# Patient Record
Sex: Female | Born: 1950
Health system: Southern US, Community
[De-identification: ages and names within clinical notes are randomized; demographics above are authoritative.]

## PROBLEM LIST (undated history)

## (undated) DIAGNOSIS — I10 Essential (primary) hypertension: Secondary | ICD-10-CM

## (undated) DIAGNOSIS — M1990 Unspecified osteoarthritis, unspecified site: Secondary | ICD-10-CM

## (undated) DIAGNOSIS — C50919 Malignant neoplasm of unspecified site of unspecified female breast: Secondary | ICD-10-CM

## (undated) DIAGNOSIS — G2581 Restless legs syndrome: Secondary | ICD-10-CM

## (undated) DIAGNOSIS — F329 Major depressive disorder, single episode, unspecified: Secondary | ICD-10-CM

## (undated) DIAGNOSIS — Z8619 Personal history of other infectious and parasitic diseases: Secondary | ICD-10-CM

## (undated) DIAGNOSIS — Z86010 Personal history of colonic polyps: Secondary | ICD-10-CM

## (undated) DIAGNOSIS — K5790 Diverticulosis of intestine, part unspecified, without perforation or abscess without bleeding: Secondary | ICD-10-CM

## (undated) DIAGNOSIS — F32A Depression, unspecified: Secondary | ICD-10-CM

## (undated) DIAGNOSIS — K589 Irritable bowel syndrome without diarrhea: Secondary | ICD-10-CM

## (undated) HISTORY — DX: Depression, unspecified: F32.A

## (undated) HISTORY — PX: PARTIAL HYSTERECTOMY: SHX80

## (undated) HISTORY — DX: Unspecified osteoarthritis, unspecified site: M19.90

## (undated) HISTORY — DX: Personal history of colonic polyps: Z86.010

## (undated) HISTORY — DX: Essential (primary) hypertension: I10

## (undated) HISTORY — DX: Restless legs syndrome: G25.81

## (undated) HISTORY — DX: Irritable bowel syndrome without diarrhea: K58.9

## (undated) HISTORY — PX: KNEE SURGERY: SHX244

## (undated) HISTORY — DX: Personal history of other infectious and parasitic diseases: Z86.19

## (undated) HISTORY — DX: Diverticulosis of intestine, part unspecified, without perforation or abscess without bleeding: K57.90

## (undated) HISTORY — DX: Malignant neoplasm of unspecified site of unspecified female breast: C50.919

## (undated) HISTORY — DX: Major depressive disorder, single episode, unspecified: F32.9

---

## 1998-06-30 ENCOUNTER — Other Ambulatory Visit: Admission: RE | Admit: 1998-06-30 | Discharge: 1998-06-30 | Payer: Self-pay | Admitting: Obstetrics and Gynecology

## 2000-05-29 ENCOUNTER — Other Ambulatory Visit: Admission: RE | Admit: 2000-05-29 | Discharge: 2000-05-29 | Payer: Self-pay | Admitting: Gynecology

## 2001-06-03 ENCOUNTER — Other Ambulatory Visit: Admission: RE | Admit: 2001-06-03 | Discharge: 2001-06-03 | Payer: Self-pay | Admitting: Gynecology

## 2001-09-02 ENCOUNTER — Encounter: Payer: Self-pay | Admitting: Pulmonary Disease

## 2001-09-02 ENCOUNTER — Ambulatory Visit (HOSPITAL_COMMUNITY): Admission: RE | Admit: 2001-09-02 | Discharge: 2001-09-02 | Payer: Self-pay | Admitting: Pulmonary Disease

## 2002-06-05 ENCOUNTER — Other Ambulatory Visit: Admission: RE | Admit: 2002-06-05 | Discharge: 2002-06-05 | Payer: Self-pay | Admitting: Gynecology

## 2003-06-15 ENCOUNTER — Other Ambulatory Visit: Admission: RE | Admit: 2003-06-15 | Discharge: 2003-06-15 | Payer: Self-pay | Admitting: Gynecology

## 2003-07-01 ENCOUNTER — Ambulatory Visit (HOSPITAL_COMMUNITY): Admission: RE | Admit: 2003-07-01 | Discharge: 2003-07-01 | Payer: Self-pay | Admitting: Orthopedic Surgery

## 2004-06-19 ENCOUNTER — Other Ambulatory Visit: Admission: RE | Admit: 2004-06-19 | Discharge: 2004-06-19 | Payer: Self-pay | Admitting: Gynecology

## 2004-09-17 DIAGNOSIS — C50919 Malignant neoplasm of unspecified site of unspecified female breast: Secondary | ICD-10-CM

## 2004-09-17 HISTORY — DX: Malignant neoplasm of unspecified site of unspecified female breast: C50.919

## 2004-11-22 ENCOUNTER — Encounter: Admission: RE | Admit: 2004-11-22 | Discharge: 2004-11-22 | Payer: Self-pay | Admitting: General Surgery

## 2004-11-24 ENCOUNTER — Encounter (INDEPENDENT_AMBULATORY_CARE_PROVIDER_SITE_OTHER): Payer: Self-pay | Admitting: *Deleted

## 2004-11-24 ENCOUNTER — Encounter: Admission: RE | Admit: 2004-11-24 | Discharge: 2004-11-24 | Payer: Self-pay | Admitting: General Surgery

## 2004-12-01 ENCOUNTER — Encounter: Admission: RE | Admit: 2004-12-01 | Discharge: 2004-12-01 | Payer: Self-pay | Admitting: General Surgery

## 2004-12-12 ENCOUNTER — Ambulatory Visit (HOSPITAL_COMMUNITY): Admission: RE | Admit: 2004-12-12 | Discharge: 2004-12-12 | Payer: Self-pay | Admitting: General Surgery

## 2004-12-12 ENCOUNTER — Encounter: Admission: RE | Admit: 2004-12-12 | Discharge: 2004-12-12 | Payer: Self-pay | Admitting: General Surgery

## 2004-12-12 ENCOUNTER — Encounter (INDEPENDENT_AMBULATORY_CARE_PROVIDER_SITE_OTHER): Payer: Self-pay | Admitting: *Deleted

## 2004-12-12 ENCOUNTER — Ambulatory Visit (HOSPITAL_BASED_OUTPATIENT_CLINIC_OR_DEPARTMENT_OTHER): Admission: RE | Admit: 2004-12-12 | Discharge: 2004-12-12 | Payer: Self-pay | Admitting: General Surgery

## 2004-12-25 ENCOUNTER — Ambulatory Visit: Admission: RE | Admit: 2004-12-25 | Discharge: 2005-03-09 | Payer: Self-pay | Admitting: *Deleted

## 2005-04-03 ENCOUNTER — Ambulatory Visit: Admission: RE | Admit: 2005-04-03 | Discharge: 2005-04-03 | Payer: Self-pay | Admitting: *Deleted

## 2009-12-29 ENCOUNTER — Ambulatory Visit: Payer: Self-pay | Admitting: Gastroenterology

## 2010-01-23 ENCOUNTER — Encounter: Admission: RE | Admit: 2010-01-23 | Discharge: 2010-01-23 | Payer: Self-pay | Admitting: Gastroenterology

## 2010-10-08 ENCOUNTER — Encounter: Payer: Self-pay | Admitting: Gastroenterology

## 2010-10-08 ENCOUNTER — Encounter: Payer: Self-pay | Admitting: Family Medicine

## 2011-02-02 NOTE — Op Note (Signed)
NAMEMERI, PELOT                ACCOUNT NO.:  [id number]   MEDICAL RECORD NO.:  [id number]          PATIENT TYPE:  AMB   LOCATION:  DSC                          FACILITY:  [facility]   PHYSICIAN:  Rose Phi. Maple Hudson, M.D.   DATE OF BIRTH:  11-22-1950   DATE OF PROCEDURE:  12/12/2004  DATE OF DISCHARGE:                                 OPERATIVE REPORT   PREOPERATIVE DIAGNOSIS:  Ductal carcinoma in situ of the right breast.   POSTOPERATIVE DIAGNOSIS:  Ductal carcinoma in situ of the right breast.   OPERATION:  Right partial mastectomy with needle localization and specimen  mammogram.   SURGEON:  Rose Phi. Maple Hudson, M.D.   ANESTHESIA:  General.   OPERATIVE PROCEDURE:  After a suitable general anesthesia was induced, the  patient was placed in a supine position with the arms extended on the arm  board.  Right breast prepped and draped in the usual fashion.  Prior to  coming in the operating room, a localizing wire had been placed in the upper  portion of her right breast.   A curved incision utilizing the wire and the information was then outlined,  and then we made an incision, and I exposed the wire and then removed the  wire and surrounding tissue.  It was oriented for the pathologist.  Specimen  mammography confirmed the removal of the lesion with what appeared to be  good margins.   Hemostasis was obtained with the cautery, and we then thoroughly infiltrated  the incision with 0.25% Marcaine.  It was then closed in two layers with 3-0  Vicryl and subcuticular 4-0 Monocryl and Steri-Strips.   Dressings were applied and the patient transferred to the recovery room in  satisfactory condition, having tolerated the procedure well.      PRY/MEDQ  D:  12/12/2004  T:  12/12/2004  Job:  [id number]

## 2011-07-06 ENCOUNTER — Ambulatory Visit
Admission: RE | Admit: 2011-07-06 | Discharge: 2011-07-06 | Disposition: A | Discharge status: Home / Self Care | Payer: BC Managed Care – PPO | Source: Ambulatory Visit | Attending: Family Medicine | Admitting: Family Medicine

## 2011-07-06 ENCOUNTER — Other Ambulatory Visit: Payer: Self-pay | Admitting: Family Medicine

## 2011-07-06 DIAGNOSIS — R1011 Right upper quadrant pain: Secondary | ICD-10-CM

## 2011-07-06 DIAGNOSIS — R1013 Epigastric pain: Secondary | ICD-10-CM

## 2012-01-18 ENCOUNTER — Other Ambulatory Visit: Payer: Self-pay | Admitting: Internal Medicine

## 2012-01-18 DIAGNOSIS — M545 Low back pain: Secondary | ICD-10-CM

## 2012-01-18 DIAGNOSIS — M5137 Other intervertebral disc degeneration, lumbosacral region: Secondary | ICD-10-CM

## 2012-01-18 DIAGNOSIS — M47817 Spondylosis without myelopathy or radiculopathy, lumbosacral region: Secondary | ICD-10-CM

## 2012-01-25 ENCOUNTER — Ambulatory Visit
Admission: RE | Admit: 2012-01-25 | Discharge: 2012-01-25 | Disposition: A | Discharge status: Home / Self Care | Payer: BC Managed Care – PPO | Source: Ambulatory Visit | Attending: Internal Medicine | Admitting: Internal Medicine

## 2012-01-25 DIAGNOSIS — M5137 Other intervertebral disc degeneration, lumbosacral region: Secondary | ICD-10-CM

## 2012-01-25 DIAGNOSIS — M545 Low back pain: Secondary | ICD-10-CM

## 2012-01-25 DIAGNOSIS — M47817 Spondylosis without myelopathy or radiculopathy, lumbosacral region: Secondary | ICD-10-CM

## 2015-10-04 DIAGNOSIS — Z79899 Other long term (current) drug therapy: Secondary | ICD-10-CM | POA: Diagnosis not present

## 2015-10-04 DIAGNOSIS — R635 Abnormal weight gain: Secondary | ICD-10-CM | POA: Diagnosis not present

## 2015-10-04 DIAGNOSIS — Z6835 Body mass index (BMI) 35.0-35.9, adult: Secondary | ICD-10-CM | POA: Diagnosis not present

## 2015-10-04 DIAGNOSIS — E669 Obesity, unspecified: Secondary | ICD-10-CM | POA: Diagnosis not present

## 2015-11-08 DIAGNOSIS — B182 Chronic viral hepatitis C: Secondary | ICD-10-CM | POA: Diagnosis not present

## 2015-11-16 DIAGNOSIS — L84 Corns and callosities: Secondary | ICD-10-CM | POA: Diagnosis not present

## 2015-11-16 DIAGNOSIS — M2041 Other hammer toe(s) (acquired), right foot: Secondary | ICD-10-CM | POA: Diagnosis not present

## 2015-11-16 DIAGNOSIS — M2042 Other hammer toe(s) (acquired), left foot: Secondary | ICD-10-CM | POA: Diagnosis not present

## 2015-11-21 DIAGNOSIS — R945 Abnormal results of liver function studies: Secondary | ICD-10-CM | POA: Diagnosis not present

## 2015-11-21 DIAGNOSIS — K732 Chronic active hepatitis, not elsewhere classified: Secondary | ICD-10-CM | POA: Diagnosis not present

## 2015-12-05 DIAGNOSIS — R7989 Other specified abnormal findings of blood chemistry: Secondary | ICD-10-CM | POA: Diagnosis not present

## 2015-12-05 DIAGNOSIS — K732 Chronic active hepatitis, not elsewhere classified: Secondary | ICD-10-CM | POA: Diagnosis not present

## 2015-12-09 DIAGNOSIS — R7989 Other specified abnormal findings of blood chemistry: Secondary | ICD-10-CM | POA: Diagnosis not present

## 2015-12-26 DIAGNOSIS — Z1231 Encounter for screening mammogram for malignant neoplasm of breast: Secondary | ICD-10-CM | POA: Diagnosis not present

## 2015-12-26 DIAGNOSIS — Z6834 Body mass index (BMI) 34.0-34.9, adult: Secondary | ICD-10-CM | POA: Diagnosis not present

## 2015-12-26 DIAGNOSIS — Z0000 Encounter for general adult medical examination without abnormal findings: Secondary | ICD-10-CM | POA: Diagnosis not present

## 2015-12-28 DIAGNOSIS — Z1231 Encounter for screening mammogram for malignant neoplasm of breast: Secondary | ICD-10-CM | POA: Diagnosis not present

## 2016-01-02 DIAGNOSIS — Z23 Encounter for immunization: Secondary | ICD-10-CM | POA: Diagnosis not present

## 2016-01-02 DIAGNOSIS — K732 Chronic active hepatitis, not elsewhere classified: Secondary | ICD-10-CM | POA: Diagnosis not present

## 2016-01-02 DIAGNOSIS — R110 Nausea: Secondary | ICD-10-CM | POA: Diagnosis not present

## 2016-02-27 DIAGNOSIS — M4726 Other spondylosis with radiculopathy, lumbar region: Secondary | ICD-10-CM | POA: Diagnosis not present

## 2016-02-27 DIAGNOSIS — I10 Essential (primary) hypertension: Secondary | ICD-10-CM | POA: Diagnosis not present

## 2016-02-27 DIAGNOSIS — M47816 Spondylosis without myelopathy or radiculopathy, lumbar region: Secondary | ICD-10-CM | POA: Diagnosis not present

## 2016-02-27 DIAGNOSIS — Z79891 Long term (current) use of opiate analgesic: Secondary | ICD-10-CM | POA: Diagnosis not present

## 2016-02-27 DIAGNOSIS — M5136 Other intervertebral disc degeneration, lumbar region: Secondary | ICD-10-CM | POA: Diagnosis not present

## 2016-02-27 DIAGNOSIS — Z6833 Body mass index (BMI) 33.0-33.9, adult: Secondary | ICD-10-CM | POA: Diagnosis not present

## 2016-02-27 DIAGNOSIS — G894 Chronic pain syndrome: Secondary | ICD-10-CM | POA: Diagnosis not present

## 2016-02-27 DIAGNOSIS — F329 Major depressive disorder, single episode, unspecified: Secondary | ICD-10-CM | POA: Diagnosis not present

## 2016-02-27 DIAGNOSIS — M545 Low back pain: Secondary | ICD-10-CM | POA: Diagnosis not present

## 2016-02-27 DIAGNOSIS — R7301 Impaired fasting glucose: Secondary | ICD-10-CM | POA: Diagnosis not present

## 2016-02-27 DIAGNOSIS — M160 Bilateral primary osteoarthritis of hip: Secondary | ICD-10-CM | POA: Diagnosis not present

## 2016-02-27 DIAGNOSIS — M25552 Pain in left hip: Secondary | ICD-10-CM | POA: Diagnosis not present

## 2016-02-27 DIAGNOSIS — E559 Vitamin D deficiency, unspecified: Secondary | ICD-10-CM | POA: Diagnosis not present

## 2016-03-07 DIAGNOSIS — M545 Low back pain: Secondary | ICD-10-CM | POA: Diagnosis not present

## 2016-03-08 DIAGNOSIS — Z79899 Other long term (current) drug therapy: Secondary | ICD-10-CM | POA: Diagnosis not present

## 2016-03-08 DIAGNOSIS — I10 Essential (primary) hypertension: Secondary | ICD-10-CM | POA: Diagnosis not present

## 2016-03-08 DIAGNOSIS — Z23 Encounter for immunization: Secondary | ICD-10-CM | POA: Diagnosis not present

## 2016-03-08 DIAGNOSIS — K732 Chronic active hepatitis, not elsewhere classified: Secondary | ICD-10-CM | POA: Diagnosis not present

## 2016-04-10 DIAGNOSIS — M4806 Spinal stenosis, lumbar region: Secondary | ICD-10-CM | POA: Diagnosis not present

## 2016-04-10 DIAGNOSIS — G894 Chronic pain syndrome: Secondary | ICD-10-CM | POA: Diagnosis not present

## 2016-04-10 DIAGNOSIS — B1920 Unspecified viral hepatitis C without hepatic coma: Secondary | ICD-10-CM | POA: Diagnosis not present

## 2016-04-10 DIAGNOSIS — M5136 Other intervertebral disc degeneration, lumbar region: Secondary | ICD-10-CM | POA: Diagnosis not present

## 2016-04-10 DIAGNOSIS — M47816 Spondylosis without myelopathy or radiculopathy, lumbar region: Secondary | ICD-10-CM | POA: Diagnosis not present

## 2016-05-03 DIAGNOSIS — I10 Essential (primary) hypertension: Secondary | ICD-10-CM | POA: Diagnosis not present

## 2016-05-03 DIAGNOSIS — E559 Vitamin D deficiency, unspecified: Secondary | ICD-10-CM | POA: Diagnosis not present

## 2016-05-03 DIAGNOSIS — M5137 Other intervertebral disc degeneration, lumbosacral region: Secondary | ICD-10-CM | POA: Diagnosis not present

## 2016-05-03 DIAGNOSIS — Z6834 Body mass index (BMI) 34.0-34.9, adult: Secondary | ICD-10-CM | POA: Diagnosis not present

## 2016-05-03 DIAGNOSIS — F329 Major depressive disorder, single episode, unspecified: Secondary | ICD-10-CM | POA: Diagnosis not present

## 2016-05-03 DIAGNOSIS — M9983 Other biomechanical lesions of lumbar region: Secondary | ICD-10-CM | POA: Diagnosis not present

## 2016-05-03 DIAGNOSIS — M4726 Other spondylosis with radiculopathy, lumbar region: Secondary | ICD-10-CM | POA: Diagnosis not present

## 2016-05-03 DIAGNOSIS — R7301 Impaired fasting glucose: Secondary | ICD-10-CM | POA: Diagnosis not present

## 2016-05-11 DIAGNOSIS — K732 Chronic active hepatitis, not elsewhere classified: Secondary | ICD-10-CM | POA: Diagnosis not present

## 2016-05-11 DIAGNOSIS — Z79899 Other long term (current) drug therapy: Secondary | ICD-10-CM | POA: Diagnosis not present

## 2016-05-11 DIAGNOSIS — R5383 Other fatigue: Secondary | ICD-10-CM | POA: Diagnosis not present

## 2016-05-31 ENCOUNTER — Other Ambulatory Visit: Payer: Self-pay | Admitting: Internal Medicine

## 2016-05-31 DIAGNOSIS — M25551 Pain in right hip: Secondary | ICD-10-CM

## 2016-05-31 DIAGNOSIS — M5136 Other intervertebral disc degeneration, lumbar region: Secondary | ICD-10-CM

## 2016-05-31 DIAGNOSIS — M25552 Pain in left hip: Secondary | ICD-10-CM

## 2016-06-04 DIAGNOSIS — E559 Vitamin D deficiency, unspecified: Secondary | ICD-10-CM | POA: Diagnosis not present

## 2016-06-04 DIAGNOSIS — G894 Chronic pain syndrome: Secondary | ICD-10-CM | POA: Diagnosis not present

## 2016-06-04 DIAGNOSIS — M4726 Other spondylosis with radiculopathy, lumbar region: Secondary | ICD-10-CM | POA: Diagnosis not present

## 2016-06-04 DIAGNOSIS — Z79891 Long term (current) use of opiate analgesic: Secondary | ICD-10-CM | POA: Diagnosis not present

## 2016-06-04 DIAGNOSIS — M5137 Other intervertebral disc degeneration, lumbosacral region: Secondary | ICD-10-CM | POA: Diagnosis not present

## 2016-06-04 DIAGNOSIS — I10 Essential (primary) hypertension: Secondary | ICD-10-CM | POA: Diagnosis not present

## 2016-06-04 DIAGNOSIS — R7301 Impaired fasting glucose: Secondary | ICD-10-CM | POA: Diagnosis not present

## 2016-06-04 DIAGNOSIS — F329 Major depressive disorder, single episode, unspecified: Secondary | ICD-10-CM | POA: Diagnosis not present

## 2016-06-04 DIAGNOSIS — Z6834 Body mass index (BMI) 34.0-34.9, adult: Secondary | ICD-10-CM | POA: Diagnosis not present

## 2016-06-08 ENCOUNTER — Other Ambulatory Visit: Payer: Self-pay

## 2016-06-11 ENCOUNTER — Ambulatory Visit
Admission: RE | Admit: 2016-06-11 | Discharge: 2016-06-11 | Disposition: A | Discharge status: Home / Self Care | Payer: PPO | Source: Ambulatory Visit | Attending: Internal Medicine | Admitting: Internal Medicine

## 2016-06-11 DIAGNOSIS — M25552 Pain in left hip: Secondary | ICD-10-CM | POA: Diagnosis not present

## 2016-06-11 DIAGNOSIS — M4806 Spinal stenosis, lumbar region: Secondary | ICD-10-CM | POA: Diagnosis not present

## 2016-06-11 DIAGNOSIS — M25551 Pain in right hip: Secondary | ICD-10-CM

## 2016-06-11 DIAGNOSIS — M1611 Unilateral primary osteoarthritis, right hip: Secondary | ICD-10-CM | POA: Diagnosis not present

## 2016-06-11 DIAGNOSIS — M5136 Other intervertebral disc degeneration, lumbar region: Secondary | ICD-10-CM

## 2016-08-02 DIAGNOSIS — M5136 Other intervertebral disc degeneration, lumbar region: Secondary | ICD-10-CM | POA: Diagnosis not present

## 2016-08-02 DIAGNOSIS — M48061 Spinal stenosis, lumbar region without neurogenic claudication: Secondary | ICD-10-CM | POA: Diagnosis not present

## 2016-08-02 DIAGNOSIS — F329 Major depressive disorder, single episode, unspecified: Secondary | ICD-10-CM | POA: Diagnosis not present

## 2016-08-02 DIAGNOSIS — B1920 Unspecified viral hepatitis C without hepatic coma: Secondary | ICD-10-CM | POA: Diagnosis not present

## 2016-08-02 DIAGNOSIS — G894 Chronic pain syndrome: Secondary | ICD-10-CM | POA: Diagnosis not present

## 2016-08-02 DIAGNOSIS — M47816 Spondylosis without myelopathy or radiculopathy, lumbar region: Secondary | ICD-10-CM | POA: Diagnosis not present

## 2016-08-14 DIAGNOSIS — K732 Chronic active hepatitis, not elsewhere classified: Secondary | ICD-10-CM | POA: Diagnosis not present

## 2016-09-04 DIAGNOSIS — Z79899 Other long term (current) drug therapy: Secondary | ICD-10-CM | POA: Diagnosis not present

## 2016-09-04 DIAGNOSIS — I10 Essential (primary) hypertension: Secondary | ICD-10-CM | POA: Diagnosis not present

## 2016-09-04 DIAGNOSIS — R7301 Impaired fasting glucose: Secondary | ICD-10-CM | POA: Diagnosis not present

## 2016-09-04 DIAGNOSIS — E559 Vitamin D deficiency, unspecified: Secondary | ICD-10-CM | POA: Diagnosis not present

## 2016-10-15 DIAGNOSIS — B1920 Unspecified viral hepatitis C without hepatic coma: Secondary | ICD-10-CM | POA: Diagnosis not present

## 2016-10-15 DIAGNOSIS — M47816 Spondylosis without myelopathy or radiculopathy, lumbar region: Secondary | ICD-10-CM | POA: Diagnosis not present

## 2016-10-15 DIAGNOSIS — M1611 Unilateral primary osteoarthritis, right hip: Secondary | ICD-10-CM | POA: Diagnosis not present

## 2016-10-15 DIAGNOSIS — G894 Chronic pain syndrome: Secondary | ICD-10-CM | POA: Diagnosis not present

## 2016-10-15 DIAGNOSIS — M5136 Other intervertebral disc degeneration, lumbar region: Secondary | ICD-10-CM | POA: Diagnosis not present

## 2016-10-15 DIAGNOSIS — F329 Major depressive disorder, single episode, unspecified: Secondary | ICD-10-CM | POA: Diagnosis not present

## 2016-11-21 DIAGNOSIS — G894 Chronic pain syndrome: Secondary | ICD-10-CM | POA: Diagnosis not present

## 2016-11-21 DIAGNOSIS — M1611 Unilateral primary osteoarthritis, right hip: Secondary | ICD-10-CM | POA: Diagnosis not present

## 2016-11-21 DIAGNOSIS — F329 Major depressive disorder, single episode, unspecified: Secondary | ICD-10-CM | POA: Diagnosis not present

## 2016-11-21 DIAGNOSIS — M5136 Other intervertebral disc degeneration, lumbar region: Secondary | ICD-10-CM | POA: Diagnosis not present

## 2016-11-21 DIAGNOSIS — M47816 Spondylosis without myelopathy or radiculopathy, lumbar region: Secondary | ICD-10-CM | POA: Diagnosis not present

## 2016-11-21 DIAGNOSIS — B1920 Unspecified viral hepatitis C without hepatic coma: Secondary | ICD-10-CM | POA: Diagnosis not present

## 2016-11-26 DIAGNOSIS — M2062 Acquired deformities of toe(s), unspecified, left foot: Secondary | ICD-10-CM | POA: Diagnosis not present

## 2016-11-26 DIAGNOSIS — M2061 Acquired deformities of toe(s), unspecified, right foot: Secondary | ICD-10-CM | POA: Diagnosis not present

## 2016-11-26 DIAGNOSIS — L84 Corns and callosities: Secondary | ICD-10-CM | POA: Diagnosis not present

## 2016-12-24 DIAGNOSIS — M47816 Spondylosis without myelopathy or radiculopathy, lumbar region: Secondary | ICD-10-CM | POA: Diagnosis not present

## 2016-12-24 DIAGNOSIS — F329 Major depressive disorder, single episode, unspecified: Secondary | ICD-10-CM | POA: Diagnosis not present

## 2016-12-24 DIAGNOSIS — G894 Chronic pain syndrome: Secondary | ICD-10-CM | POA: Diagnosis not present

## 2016-12-24 DIAGNOSIS — M1611 Unilateral primary osteoarthritis, right hip: Secondary | ICD-10-CM | POA: Diagnosis not present

## 2016-12-24 DIAGNOSIS — M5136 Other intervertebral disc degeneration, lumbar region: Secondary | ICD-10-CM | POA: Diagnosis not present

## 2016-12-24 DIAGNOSIS — B1920 Unspecified viral hepatitis C without hepatic coma: Secondary | ICD-10-CM | POA: Diagnosis not present

## 2016-12-26 DIAGNOSIS — F329 Major depressive disorder, single episode, unspecified: Secondary | ICD-10-CM | POA: Diagnosis not present

## 2016-12-26 DIAGNOSIS — G894 Chronic pain syndrome: Secondary | ICD-10-CM | POA: Diagnosis not present

## 2016-12-26 DIAGNOSIS — M47816 Spondylosis without myelopathy or radiculopathy, lumbar region: Secondary | ICD-10-CM | POA: Diagnosis not present

## 2016-12-26 DIAGNOSIS — M1611 Unilateral primary osteoarthritis, right hip: Secondary | ICD-10-CM | POA: Diagnosis not present

## 2016-12-26 DIAGNOSIS — M5136 Other intervertebral disc degeneration, lumbar region: Secondary | ICD-10-CM | POA: Diagnosis not present

## 2016-12-26 DIAGNOSIS — B1920 Unspecified viral hepatitis C without hepatic coma: Secondary | ICD-10-CM | POA: Diagnosis not present

## 2017-01-08 DIAGNOSIS — E559 Vitamin D deficiency, unspecified: Secondary | ICD-10-CM | POA: Diagnosis not present

## 2017-01-08 DIAGNOSIS — E6609 Other obesity due to excess calories: Secondary | ICD-10-CM | POA: Diagnosis not present

## 2017-01-08 DIAGNOSIS — Z713 Dietary counseling and surveillance: Secondary | ICD-10-CM | POA: Diagnosis not present

## 2017-01-08 DIAGNOSIS — E790 Hyperuricemia without signs of inflammatory arthritis and tophaceous disease: Secondary | ICD-10-CM | POA: Diagnosis not present

## 2017-01-08 DIAGNOSIS — E782 Mixed hyperlipidemia: Secondary | ICD-10-CM | POA: Diagnosis not present

## 2017-01-08 DIAGNOSIS — E8881 Metabolic syndrome: Secondary | ICD-10-CM | POA: Diagnosis not present

## 2017-01-08 DIAGNOSIS — E039 Hypothyroidism, unspecified: Secondary | ICD-10-CM | POA: Diagnosis not present

## 2017-01-08 DIAGNOSIS — E1165 Type 2 diabetes mellitus with hyperglycemia: Secondary | ICD-10-CM | POA: Diagnosis not present

## 2017-01-08 DIAGNOSIS — I10 Essential (primary) hypertension: Secondary | ICD-10-CM | POA: Diagnosis not present

## 2017-01-11 DIAGNOSIS — Z6836 Body mass index (BMI) 36.0-36.9, adult: Secondary | ICD-10-CM | POA: Diagnosis not present

## 2017-01-11 DIAGNOSIS — Z1231 Encounter for screening mammogram for malignant neoplasm of breast: Secondary | ICD-10-CM | POA: Diagnosis not present

## 2017-01-11 DIAGNOSIS — J208 Acute bronchitis due to other specified organisms: Secondary | ICD-10-CM | POA: Diagnosis not present

## 2017-01-15 DIAGNOSIS — J208 Acute bronchitis due to other specified organisms: Secondary | ICD-10-CM | POA: Diagnosis not present

## 2017-01-15 DIAGNOSIS — Z6835 Body mass index (BMI) 35.0-35.9, adult: Secondary | ICD-10-CM | POA: Diagnosis not present

## 2017-01-15 DIAGNOSIS — T3695XA Adverse effect of unspecified systemic antibiotic, initial encounter: Secondary | ICD-10-CM | POA: Diagnosis not present

## 2017-01-21 DIAGNOSIS — G894 Chronic pain syndrome: Secondary | ICD-10-CM | POA: Diagnosis not present

## 2017-01-21 DIAGNOSIS — B1920 Unspecified viral hepatitis C without hepatic coma: Secondary | ICD-10-CM | POA: Diagnosis not present

## 2017-01-21 DIAGNOSIS — M5136 Other intervertebral disc degeneration, lumbar region: Secondary | ICD-10-CM | POA: Diagnosis not present

## 2017-01-21 DIAGNOSIS — M47816 Spondylosis without myelopathy or radiculopathy, lumbar region: Secondary | ICD-10-CM | POA: Diagnosis not present

## 2017-01-21 DIAGNOSIS — M1611 Unilateral primary osteoarthritis, right hip: Secondary | ICD-10-CM | POA: Diagnosis not present

## 2017-01-21 DIAGNOSIS — F329 Major depressive disorder, single episode, unspecified: Secondary | ICD-10-CM | POA: Diagnosis not present

## 2017-01-28 DIAGNOSIS — Z1389 Encounter for screening for other disorder: Secondary | ICD-10-CM | POA: Diagnosis not present

## 2017-01-28 DIAGNOSIS — Z23 Encounter for immunization: Secondary | ICD-10-CM | POA: Diagnosis not present

## 2017-01-28 DIAGNOSIS — Z1211 Encounter for screening for malignant neoplasm of colon: Secondary | ICD-10-CM | POA: Diagnosis not present

## 2017-01-28 DIAGNOSIS — Z9181 History of falling: Secondary | ICD-10-CM | POA: Diagnosis not present

## 2017-01-28 DIAGNOSIS — Z0000 Encounter for general adult medical examination without abnormal findings: Secondary | ICD-10-CM | POA: Diagnosis not present

## 2017-01-28 DIAGNOSIS — Z6835 Body mass index (BMI) 35.0-35.9, adult: Secondary | ICD-10-CM | POA: Diagnosis not present

## 2017-01-31 DIAGNOSIS — E669 Obesity, unspecified: Secondary | ICD-10-CM | POA: Diagnosis not present

## 2017-01-31 DIAGNOSIS — R7301 Impaired fasting glucose: Secondary | ICD-10-CM | POA: Diagnosis not present

## 2017-01-31 DIAGNOSIS — Z79899 Other long term (current) drug therapy: Secondary | ICD-10-CM | POA: Diagnosis not present

## 2017-01-31 DIAGNOSIS — Z0000 Encounter for general adult medical examination without abnormal findings: Secondary | ICD-10-CM | POA: Diagnosis not present

## 2017-01-31 DIAGNOSIS — I10 Essential (primary) hypertension: Secondary | ICD-10-CM | POA: Diagnosis not present

## 2017-01-31 DIAGNOSIS — Z6835 Body mass index (BMI) 35.0-35.9, adult: Secondary | ICD-10-CM | POA: Diagnosis not present

## 2017-02-08 DIAGNOSIS — E8881 Metabolic syndrome: Secondary | ICD-10-CM | POA: Diagnosis not present

## 2017-02-08 DIAGNOSIS — I10 Essential (primary) hypertension: Secondary | ICD-10-CM | POA: Diagnosis not present

## 2017-02-08 DIAGNOSIS — E6609 Other obesity due to excess calories: Secondary | ICD-10-CM | POA: Diagnosis not present

## 2017-02-08 DIAGNOSIS — Z713 Dietary counseling and surveillance: Secondary | ICD-10-CM | POA: Diagnosis not present

## 2017-02-11 DIAGNOSIS — Z1231 Encounter for screening mammogram for malignant neoplasm of breast: Secondary | ICD-10-CM | POA: Diagnosis not present

## 2017-02-11 DIAGNOSIS — M85852 Other specified disorders of bone density and structure, left thigh: Secondary | ICD-10-CM | POA: Diagnosis not present

## 2017-02-11 DIAGNOSIS — Z853 Personal history of malignant neoplasm of breast: Secondary | ICD-10-CM | POA: Diagnosis not present

## 2017-02-11 DIAGNOSIS — M8589 Other specified disorders of bone density and structure, multiple sites: Secondary | ICD-10-CM | POA: Diagnosis not present

## 2017-02-15 DIAGNOSIS — Z1211 Encounter for screening for malignant neoplasm of colon: Secondary | ICD-10-CM | POA: Diagnosis not present

## 2017-02-15 DIAGNOSIS — K581 Irritable bowel syndrome with constipation: Secondary | ICD-10-CM | POA: Diagnosis not present

## 2017-02-15 DIAGNOSIS — Z86010 Personal history of colonic polyps: Secondary | ICD-10-CM | POA: Diagnosis not present

## 2017-02-18 DIAGNOSIS — M1611 Unilateral primary osteoarthritis, right hip: Secondary | ICD-10-CM | POA: Diagnosis not present

## 2017-02-18 DIAGNOSIS — M7062 Trochanteric bursitis, left hip: Secondary | ICD-10-CM | POA: Diagnosis not present

## 2017-02-18 DIAGNOSIS — F329 Major depressive disorder, single episode, unspecified: Secondary | ICD-10-CM | POA: Diagnosis not present

## 2017-02-18 DIAGNOSIS — M47816 Spondylosis without myelopathy or radiculopathy, lumbar region: Secondary | ICD-10-CM | POA: Diagnosis not present

## 2017-02-18 DIAGNOSIS — G894 Chronic pain syndrome: Secondary | ICD-10-CM | POA: Diagnosis not present

## 2017-02-18 DIAGNOSIS — B1920 Unspecified viral hepatitis C without hepatic coma: Secondary | ICD-10-CM | POA: Diagnosis not present

## 2017-02-18 DIAGNOSIS — M5136 Other intervertebral disc degeneration, lumbar region: Secondary | ICD-10-CM | POA: Diagnosis not present

## 2017-03-11 DIAGNOSIS — E6609 Other obesity due to excess calories: Secondary | ICD-10-CM | POA: Diagnosis not present

## 2017-03-11 DIAGNOSIS — E8881 Metabolic syndrome: Secondary | ICD-10-CM | POA: Diagnosis not present

## 2017-03-11 DIAGNOSIS — Z713 Dietary counseling and surveillance: Secondary | ICD-10-CM | POA: Diagnosis not present

## 2017-03-11 DIAGNOSIS — I10 Essential (primary) hypertension: Secondary | ICD-10-CM | POA: Diagnosis not present

## 2017-03-18 DIAGNOSIS — F1120 Opioid dependence, uncomplicated: Secondary | ICD-10-CM | POA: Diagnosis not present

## 2017-03-18 DIAGNOSIS — M47816 Spondylosis without myelopathy or radiculopathy, lumbar region: Secondary | ICD-10-CM | POA: Diagnosis not present

## 2017-03-18 DIAGNOSIS — F329 Major depressive disorder, single episode, unspecified: Secondary | ICD-10-CM | POA: Diagnosis not present

## 2017-03-18 DIAGNOSIS — M5136 Other intervertebral disc degeneration, lumbar region: Secondary | ICD-10-CM | POA: Diagnosis not present

## 2017-03-18 DIAGNOSIS — B1920 Unspecified viral hepatitis C without hepatic coma: Secondary | ICD-10-CM | POA: Diagnosis not present

## 2017-03-18 DIAGNOSIS — M1611 Unilateral primary osteoarthritis, right hip: Secondary | ICD-10-CM | POA: Diagnosis not present

## 2017-03-18 DIAGNOSIS — M7062 Trochanteric bursitis, left hip: Secondary | ICD-10-CM | POA: Diagnosis not present

## 2017-03-18 DIAGNOSIS — G894 Chronic pain syndrome: Secondary | ICD-10-CM | POA: Diagnosis not present

## 2017-04-15 DIAGNOSIS — S336XXA Sprain of sacroiliac joint, initial encounter: Secondary | ICD-10-CM | POA: Diagnosis not present

## 2017-04-15 DIAGNOSIS — M542 Cervicalgia: Secondary | ICD-10-CM | POA: Diagnosis not present

## 2017-04-15 DIAGNOSIS — M9905 Segmental and somatic dysfunction of pelvic region: Secondary | ICD-10-CM | POA: Diagnosis not present

## 2017-04-15 DIAGNOSIS — M5136 Other intervertebral disc degeneration, lumbar region: Secondary | ICD-10-CM | POA: Diagnosis not present

## 2017-04-15 DIAGNOSIS — M9901 Segmental and somatic dysfunction of cervical region: Secondary | ICD-10-CM | POA: Diagnosis not present

## 2017-04-15 DIAGNOSIS — M9902 Segmental and somatic dysfunction of thoracic region: Secondary | ICD-10-CM | POA: Diagnosis not present

## 2017-04-15 DIAGNOSIS — M545 Low back pain: Secondary | ICD-10-CM | POA: Diagnosis not present

## 2017-04-15 DIAGNOSIS — M9903 Segmental and somatic dysfunction of lumbar region: Secondary | ICD-10-CM | POA: Diagnosis not present

## 2017-04-16 DIAGNOSIS — M9901 Segmental and somatic dysfunction of cervical region: Secondary | ICD-10-CM | POA: Diagnosis not present

## 2017-04-16 DIAGNOSIS — M545 Low back pain: Secondary | ICD-10-CM | POA: Diagnosis not present

## 2017-04-16 DIAGNOSIS — F329 Major depressive disorder, single episode, unspecified: Secondary | ICD-10-CM | POA: Diagnosis not present

## 2017-04-16 DIAGNOSIS — M9905 Segmental and somatic dysfunction of pelvic region: Secondary | ICD-10-CM | POA: Diagnosis not present

## 2017-04-16 DIAGNOSIS — M1611 Unilateral primary osteoarthritis, right hip: Secondary | ICD-10-CM | POA: Diagnosis not present

## 2017-04-16 DIAGNOSIS — S336XXA Sprain of sacroiliac joint, initial encounter: Secondary | ICD-10-CM | POA: Diagnosis not present

## 2017-04-16 DIAGNOSIS — M7062 Trochanteric bursitis, left hip: Secondary | ICD-10-CM | POA: Diagnosis not present

## 2017-04-16 DIAGNOSIS — M47816 Spondylosis without myelopathy or radiculopathy, lumbar region: Secondary | ICD-10-CM | POA: Diagnosis not present

## 2017-04-16 DIAGNOSIS — M9903 Segmental and somatic dysfunction of lumbar region: Secondary | ICD-10-CM | POA: Diagnosis not present

## 2017-04-16 DIAGNOSIS — M9902 Segmental and somatic dysfunction of thoracic region: Secondary | ICD-10-CM | POA: Diagnosis not present

## 2017-04-16 DIAGNOSIS — M542 Cervicalgia: Secondary | ICD-10-CM | POA: Diagnosis not present

## 2017-04-16 DIAGNOSIS — B1920 Unspecified viral hepatitis C without hepatic coma: Secondary | ICD-10-CM | POA: Diagnosis not present

## 2017-04-16 DIAGNOSIS — M5136 Other intervertebral disc degeneration, lumbar region: Secondary | ICD-10-CM | POA: Diagnosis not present

## 2017-04-16 DIAGNOSIS — G894 Chronic pain syndrome: Secondary | ICD-10-CM | POA: Diagnosis not present

## 2017-04-18 DIAGNOSIS — M542 Cervicalgia: Secondary | ICD-10-CM | POA: Diagnosis not present

## 2017-04-18 DIAGNOSIS — M9901 Segmental and somatic dysfunction of cervical region: Secondary | ICD-10-CM | POA: Diagnosis not present

## 2017-04-18 DIAGNOSIS — M5136 Other intervertebral disc degeneration, lumbar region: Secondary | ICD-10-CM | POA: Diagnosis not present

## 2017-04-18 DIAGNOSIS — M9902 Segmental and somatic dysfunction of thoracic region: Secondary | ICD-10-CM | POA: Diagnosis not present

## 2017-04-18 DIAGNOSIS — M9903 Segmental and somatic dysfunction of lumbar region: Secondary | ICD-10-CM | POA: Diagnosis not present

## 2017-04-18 DIAGNOSIS — S336XXA Sprain of sacroiliac joint, initial encounter: Secondary | ICD-10-CM | POA: Diagnosis not present

## 2017-04-18 DIAGNOSIS — M545 Low back pain: Secondary | ICD-10-CM | POA: Diagnosis not present

## 2017-04-18 DIAGNOSIS — M9905 Segmental and somatic dysfunction of pelvic region: Secondary | ICD-10-CM | POA: Diagnosis not present

## 2017-04-22 DIAGNOSIS — S336XXA Sprain of sacroiliac joint, initial encounter: Secondary | ICD-10-CM | POA: Diagnosis not present

## 2017-04-22 DIAGNOSIS — M9901 Segmental and somatic dysfunction of cervical region: Secondary | ICD-10-CM | POA: Diagnosis not present

## 2017-04-22 DIAGNOSIS — M9905 Segmental and somatic dysfunction of pelvic region: Secondary | ICD-10-CM | POA: Diagnosis not present

## 2017-04-22 DIAGNOSIS — M9902 Segmental and somatic dysfunction of thoracic region: Secondary | ICD-10-CM | POA: Diagnosis not present

## 2017-04-22 DIAGNOSIS — M542 Cervicalgia: Secondary | ICD-10-CM | POA: Diagnosis not present

## 2017-04-22 DIAGNOSIS — M5136 Other intervertebral disc degeneration, lumbar region: Secondary | ICD-10-CM | POA: Diagnosis not present

## 2017-04-22 DIAGNOSIS — M9903 Segmental and somatic dysfunction of lumbar region: Secondary | ICD-10-CM | POA: Diagnosis not present

## 2017-04-22 DIAGNOSIS — M545 Low back pain: Secondary | ICD-10-CM | POA: Diagnosis not present

## 2017-04-23 DIAGNOSIS — M542 Cervicalgia: Secondary | ICD-10-CM | POA: Diagnosis not present

## 2017-04-23 DIAGNOSIS — M9903 Segmental and somatic dysfunction of lumbar region: Secondary | ICD-10-CM | POA: Diagnosis not present

## 2017-04-23 DIAGNOSIS — M5136 Other intervertebral disc degeneration, lumbar region: Secondary | ICD-10-CM | POA: Diagnosis not present

## 2017-04-23 DIAGNOSIS — M9902 Segmental and somatic dysfunction of thoracic region: Secondary | ICD-10-CM | POA: Diagnosis not present

## 2017-04-23 DIAGNOSIS — S336XXA Sprain of sacroiliac joint, initial encounter: Secondary | ICD-10-CM | POA: Diagnosis not present

## 2017-04-23 DIAGNOSIS — M545 Low back pain: Secondary | ICD-10-CM | POA: Diagnosis not present

## 2017-04-23 DIAGNOSIS — M9905 Segmental and somatic dysfunction of pelvic region: Secondary | ICD-10-CM | POA: Diagnosis not present

## 2017-04-23 DIAGNOSIS — M9901 Segmental and somatic dysfunction of cervical region: Secondary | ICD-10-CM | POA: Diagnosis not present

## 2017-04-30 DIAGNOSIS — M9903 Segmental and somatic dysfunction of lumbar region: Secondary | ICD-10-CM | POA: Diagnosis not present

## 2017-04-30 DIAGNOSIS — M9901 Segmental and somatic dysfunction of cervical region: Secondary | ICD-10-CM | POA: Diagnosis not present

## 2017-04-30 DIAGNOSIS — M545 Low back pain: Secondary | ICD-10-CM | POA: Diagnosis not present

## 2017-04-30 DIAGNOSIS — M9902 Segmental and somatic dysfunction of thoracic region: Secondary | ICD-10-CM | POA: Diagnosis not present

## 2017-04-30 DIAGNOSIS — M542 Cervicalgia: Secondary | ICD-10-CM | POA: Diagnosis not present

## 2017-04-30 DIAGNOSIS — M9905 Segmental and somatic dysfunction of pelvic region: Secondary | ICD-10-CM | POA: Diagnosis not present

## 2017-04-30 DIAGNOSIS — S336XXA Sprain of sacroiliac joint, initial encounter: Secondary | ICD-10-CM | POA: Diagnosis not present

## 2017-04-30 DIAGNOSIS — M5136 Other intervertebral disc degeneration, lumbar region: Secondary | ICD-10-CM | POA: Diagnosis not present

## 2017-05-01 DIAGNOSIS — M545 Low back pain: Secondary | ICD-10-CM | POA: Diagnosis not present

## 2017-05-01 DIAGNOSIS — M9901 Segmental and somatic dysfunction of cervical region: Secondary | ICD-10-CM | POA: Diagnosis not present

## 2017-05-01 DIAGNOSIS — M9902 Segmental and somatic dysfunction of thoracic region: Secondary | ICD-10-CM | POA: Diagnosis not present

## 2017-05-01 DIAGNOSIS — M5136 Other intervertebral disc degeneration, lumbar region: Secondary | ICD-10-CM | POA: Diagnosis not present

## 2017-05-01 DIAGNOSIS — M9905 Segmental and somatic dysfunction of pelvic region: Secondary | ICD-10-CM | POA: Diagnosis not present

## 2017-05-01 DIAGNOSIS — S336XXA Sprain of sacroiliac joint, initial encounter: Secondary | ICD-10-CM | POA: Diagnosis not present

## 2017-05-01 DIAGNOSIS — M9903 Segmental and somatic dysfunction of lumbar region: Secondary | ICD-10-CM | POA: Diagnosis not present

## 2017-05-01 DIAGNOSIS — M542 Cervicalgia: Secondary | ICD-10-CM | POA: Diagnosis not present

## 2017-05-06 DIAGNOSIS — M9902 Segmental and somatic dysfunction of thoracic region: Secondary | ICD-10-CM | POA: Diagnosis not present

## 2017-05-06 DIAGNOSIS — M5136 Other intervertebral disc degeneration, lumbar region: Secondary | ICD-10-CM | POA: Diagnosis not present

## 2017-05-06 DIAGNOSIS — S336XXA Sprain of sacroiliac joint, initial encounter: Secondary | ICD-10-CM | POA: Diagnosis not present

## 2017-05-06 DIAGNOSIS — M545 Low back pain: Secondary | ICD-10-CM | POA: Diagnosis not present

## 2017-05-06 DIAGNOSIS — M9903 Segmental and somatic dysfunction of lumbar region: Secondary | ICD-10-CM | POA: Diagnosis not present

## 2017-05-06 DIAGNOSIS — M9905 Segmental and somatic dysfunction of pelvic region: Secondary | ICD-10-CM | POA: Diagnosis not present

## 2017-05-06 DIAGNOSIS — M542 Cervicalgia: Secondary | ICD-10-CM | POA: Diagnosis not present

## 2017-05-06 DIAGNOSIS — M9901 Segmental and somatic dysfunction of cervical region: Secondary | ICD-10-CM | POA: Diagnosis not present

## 2017-05-07 DIAGNOSIS — M545 Low back pain: Secondary | ICD-10-CM | POA: Diagnosis not present

## 2017-05-07 DIAGNOSIS — S336XXA Sprain of sacroiliac joint, initial encounter: Secondary | ICD-10-CM | POA: Diagnosis not present

## 2017-05-07 DIAGNOSIS — M9901 Segmental and somatic dysfunction of cervical region: Secondary | ICD-10-CM | POA: Diagnosis not present

## 2017-05-07 DIAGNOSIS — M542 Cervicalgia: Secondary | ICD-10-CM | POA: Diagnosis not present

## 2017-05-07 DIAGNOSIS — M9902 Segmental and somatic dysfunction of thoracic region: Secondary | ICD-10-CM | POA: Diagnosis not present

## 2017-05-07 DIAGNOSIS — M5136 Other intervertebral disc degeneration, lumbar region: Secondary | ICD-10-CM | POA: Diagnosis not present

## 2017-05-07 DIAGNOSIS — M9903 Segmental and somatic dysfunction of lumbar region: Secondary | ICD-10-CM | POA: Diagnosis not present

## 2017-05-07 DIAGNOSIS — M9905 Segmental and somatic dysfunction of pelvic region: Secondary | ICD-10-CM | POA: Diagnosis not present

## 2017-05-08 DIAGNOSIS — M5136 Other intervertebral disc degeneration, lumbar region: Secondary | ICD-10-CM | POA: Diagnosis not present

## 2017-05-08 DIAGNOSIS — M9901 Segmental and somatic dysfunction of cervical region: Secondary | ICD-10-CM | POA: Diagnosis not present

## 2017-05-08 DIAGNOSIS — S336XXA Sprain of sacroiliac joint, initial encounter: Secondary | ICD-10-CM | POA: Diagnosis not present

## 2017-05-08 DIAGNOSIS — M9903 Segmental and somatic dysfunction of lumbar region: Secondary | ICD-10-CM | POA: Diagnosis not present

## 2017-05-08 DIAGNOSIS — M9902 Segmental and somatic dysfunction of thoracic region: Secondary | ICD-10-CM | POA: Diagnosis not present

## 2017-05-08 DIAGNOSIS — M542 Cervicalgia: Secondary | ICD-10-CM | POA: Diagnosis not present

## 2017-05-08 DIAGNOSIS — M9905 Segmental and somatic dysfunction of pelvic region: Secondary | ICD-10-CM | POA: Diagnosis not present

## 2017-05-08 DIAGNOSIS — M545 Low back pain: Secondary | ICD-10-CM | POA: Diagnosis not present

## 2017-05-13 DIAGNOSIS — M545 Low back pain: Secondary | ICD-10-CM | POA: Diagnosis not present

## 2017-05-13 DIAGNOSIS — M542 Cervicalgia: Secondary | ICD-10-CM | POA: Diagnosis not present

## 2017-05-13 DIAGNOSIS — M9905 Segmental and somatic dysfunction of pelvic region: Secondary | ICD-10-CM | POA: Diagnosis not present

## 2017-05-13 DIAGNOSIS — S336XXA Sprain of sacroiliac joint, initial encounter: Secondary | ICD-10-CM | POA: Diagnosis not present

## 2017-05-13 DIAGNOSIS — M9901 Segmental and somatic dysfunction of cervical region: Secondary | ICD-10-CM | POA: Diagnosis not present

## 2017-05-13 DIAGNOSIS — M9903 Segmental and somatic dysfunction of lumbar region: Secondary | ICD-10-CM | POA: Diagnosis not present

## 2017-05-13 DIAGNOSIS — M5136 Other intervertebral disc degeneration, lumbar region: Secondary | ICD-10-CM | POA: Diagnosis not present

## 2017-05-13 DIAGNOSIS — M9902 Segmental and somatic dysfunction of thoracic region: Secondary | ICD-10-CM | POA: Diagnosis not present

## 2017-05-15 DIAGNOSIS — G894 Chronic pain syndrome: Secondary | ICD-10-CM | POA: Diagnosis not present

## 2017-05-15 DIAGNOSIS — F329 Major depressive disorder, single episode, unspecified: Secondary | ICD-10-CM | POA: Diagnosis not present

## 2017-05-15 DIAGNOSIS — M7062 Trochanteric bursitis, left hip: Secondary | ICD-10-CM | POA: Diagnosis not present

## 2017-05-15 DIAGNOSIS — M5136 Other intervertebral disc degeneration, lumbar region: Secondary | ICD-10-CM | POA: Diagnosis not present

## 2017-05-15 DIAGNOSIS — M47816 Spondylosis without myelopathy or radiculopathy, lumbar region: Secondary | ICD-10-CM | POA: Diagnosis not present

## 2017-05-15 DIAGNOSIS — M1611 Unilateral primary osteoarthritis, right hip: Secondary | ICD-10-CM | POA: Diagnosis not present

## 2017-05-15 DIAGNOSIS — B1920 Unspecified viral hepatitis C without hepatic coma: Secondary | ICD-10-CM | POA: Diagnosis not present

## 2017-05-16 DIAGNOSIS — M9902 Segmental and somatic dysfunction of thoracic region: Secondary | ICD-10-CM | POA: Diagnosis not present

## 2017-05-16 DIAGNOSIS — M542 Cervicalgia: Secondary | ICD-10-CM | POA: Diagnosis not present

## 2017-05-16 DIAGNOSIS — S336XXA Sprain of sacroiliac joint, initial encounter: Secondary | ICD-10-CM | POA: Diagnosis not present

## 2017-05-16 DIAGNOSIS — M9903 Segmental and somatic dysfunction of lumbar region: Secondary | ICD-10-CM | POA: Diagnosis not present

## 2017-05-16 DIAGNOSIS — M545 Low back pain: Secondary | ICD-10-CM | POA: Diagnosis not present

## 2017-05-16 DIAGNOSIS — M9901 Segmental and somatic dysfunction of cervical region: Secondary | ICD-10-CM | POA: Diagnosis not present

## 2017-05-16 DIAGNOSIS — M9905 Segmental and somatic dysfunction of pelvic region: Secondary | ICD-10-CM | POA: Diagnosis not present

## 2017-05-16 DIAGNOSIS — M5136 Other intervertebral disc degeneration, lumbar region: Secondary | ICD-10-CM | POA: Diagnosis not present

## 2017-05-21 DIAGNOSIS — M542 Cervicalgia: Secondary | ICD-10-CM | POA: Diagnosis not present

## 2017-05-21 DIAGNOSIS — S336XXA Sprain of sacroiliac joint, initial encounter: Secondary | ICD-10-CM | POA: Diagnosis not present

## 2017-05-21 DIAGNOSIS — M9903 Segmental and somatic dysfunction of lumbar region: Secondary | ICD-10-CM | POA: Diagnosis not present

## 2017-05-21 DIAGNOSIS — M545 Low back pain: Secondary | ICD-10-CM | POA: Diagnosis not present

## 2017-05-21 DIAGNOSIS — M9902 Segmental and somatic dysfunction of thoracic region: Secondary | ICD-10-CM | POA: Diagnosis not present

## 2017-05-21 DIAGNOSIS — M9901 Segmental and somatic dysfunction of cervical region: Secondary | ICD-10-CM | POA: Diagnosis not present

## 2017-05-21 DIAGNOSIS — M9905 Segmental and somatic dysfunction of pelvic region: Secondary | ICD-10-CM | POA: Diagnosis not present

## 2017-05-21 DIAGNOSIS — M5136 Other intervertebral disc degeneration, lumbar region: Secondary | ICD-10-CM | POA: Diagnosis not present

## 2017-05-22 DIAGNOSIS — M9902 Segmental and somatic dysfunction of thoracic region: Secondary | ICD-10-CM | POA: Diagnosis not present

## 2017-05-22 DIAGNOSIS — M545 Low back pain: Secondary | ICD-10-CM | POA: Diagnosis not present

## 2017-05-22 DIAGNOSIS — S336XXA Sprain of sacroiliac joint, initial encounter: Secondary | ICD-10-CM | POA: Diagnosis not present

## 2017-05-22 DIAGNOSIS — M9905 Segmental and somatic dysfunction of pelvic region: Secondary | ICD-10-CM | POA: Diagnosis not present

## 2017-05-22 DIAGNOSIS — M9901 Segmental and somatic dysfunction of cervical region: Secondary | ICD-10-CM | POA: Diagnosis not present

## 2017-05-22 DIAGNOSIS — M542 Cervicalgia: Secondary | ICD-10-CM | POA: Diagnosis not present

## 2017-05-22 DIAGNOSIS — M9903 Segmental and somatic dysfunction of lumbar region: Secondary | ICD-10-CM | POA: Diagnosis not present

## 2017-05-22 DIAGNOSIS — M5136 Other intervertebral disc degeneration, lumbar region: Secondary | ICD-10-CM | POA: Diagnosis not present

## 2017-05-27 DIAGNOSIS — Z79899 Other long term (current) drug therapy: Secondary | ICD-10-CM | POA: Diagnosis not present

## 2017-05-27 DIAGNOSIS — K219 Gastro-esophageal reflux disease without esophagitis: Secondary | ICD-10-CM | POA: Diagnosis not present

## 2017-05-27 DIAGNOSIS — G2581 Restless legs syndrome: Secondary | ICD-10-CM | POA: Diagnosis not present

## 2017-05-27 DIAGNOSIS — F419 Anxiety disorder, unspecified: Secondary | ICD-10-CM | POA: Diagnosis not present

## 2017-05-27 DIAGNOSIS — M549 Dorsalgia, unspecified: Secondary | ICD-10-CM | POA: Diagnosis not present

## 2017-05-27 DIAGNOSIS — K589 Irritable bowel syndrome without diarrhea: Secondary | ICD-10-CM | POA: Diagnosis not present

## 2017-05-27 DIAGNOSIS — K5730 Diverticulosis of large intestine without perforation or abscess without bleeding: Secondary | ICD-10-CM | POA: Diagnosis not present

## 2017-05-27 DIAGNOSIS — Z86010 Personal history of colonic polyps: Secondary | ICD-10-CM | POA: Diagnosis not present

## 2017-05-27 DIAGNOSIS — Z6834 Body mass index (BMI) 34.0-34.9, adult: Secondary | ICD-10-CM | POA: Diagnosis not present

## 2017-05-27 DIAGNOSIS — F329 Major depressive disorder, single episode, unspecified: Secondary | ICD-10-CM | POA: Diagnosis not present

## 2017-05-27 DIAGNOSIS — G8929 Other chronic pain: Secondary | ICD-10-CM | POA: Diagnosis not present

## 2017-05-27 DIAGNOSIS — I10 Essential (primary) hypertension: Secondary | ICD-10-CM | POA: Diagnosis not present

## 2017-05-27 DIAGNOSIS — Z1211 Encounter for screening for malignant neoplasm of colon: Secondary | ICD-10-CM | POA: Diagnosis not present

## 2017-05-27 DIAGNOSIS — Z8619 Personal history of other infectious and parasitic diseases: Secondary | ICD-10-CM | POA: Diagnosis not present

## 2017-05-27 DIAGNOSIS — Z09 Encounter for follow-up examination after completed treatment for conditions other than malignant neoplasm: Secondary | ICD-10-CM | POA: Diagnosis not present

## 2017-05-27 DIAGNOSIS — E669 Obesity, unspecified: Secondary | ICD-10-CM | POA: Diagnosis not present

## 2017-05-27 DIAGNOSIS — K648 Other hemorrhoids: Secondary | ICD-10-CM | POA: Diagnosis not present

## 2017-05-27 DIAGNOSIS — Z79891 Long term (current) use of opiate analgesic: Secondary | ICD-10-CM | POA: Diagnosis not present

## 2017-05-27 HISTORY — PX: COLONOSCOPY: SHX174

## 2017-05-29 DIAGNOSIS — M9902 Segmental and somatic dysfunction of thoracic region: Secondary | ICD-10-CM | POA: Diagnosis not present

## 2017-05-29 DIAGNOSIS — S336XXA Sprain of sacroiliac joint, initial encounter: Secondary | ICD-10-CM | POA: Diagnosis not present

## 2017-05-29 DIAGNOSIS — M5136 Other intervertebral disc degeneration, lumbar region: Secondary | ICD-10-CM | POA: Diagnosis not present

## 2017-05-29 DIAGNOSIS — M545 Low back pain: Secondary | ICD-10-CM | POA: Diagnosis not present

## 2017-05-29 DIAGNOSIS — M9901 Segmental and somatic dysfunction of cervical region: Secondary | ICD-10-CM | POA: Diagnosis not present

## 2017-05-29 DIAGNOSIS — M9905 Segmental and somatic dysfunction of pelvic region: Secondary | ICD-10-CM | POA: Diagnosis not present

## 2017-05-29 DIAGNOSIS — M9903 Segmental and somatic dysfunction of lumbar region: Secondary | ICD-10-CM | POA: Diagnosis not present

## 2017-05-29 DIAGNOSIS — M542 Cervicalgia: Secondary | ICD-10-CM | POA: Diagnosis not present

## 2017-06-05 DIAGNOSIS — M9901 Segmental and somatic dysfunction of cervical region: Secondary | ICD-10-CM | POA: Diagnosis not present

## 2017-06-05 DIAGNOSIS — M5136 Other intervertebral disc degeneration, lumbar region: Secondary | ICD-10-CM | POA: Diagnosis not present

## 2017-06-05 DIAGNOSIS — M545 Low back pain: Secondary | ICD-10-CM | POA: Diagnosis not present

## 2017-06-05 DIAGNOSIS — M9902 Segmental and somatic dysfunction of thoracic region: Secondary | ICD-10-CM | POA: Diagnosis not present

## 2017-06-05 DIAGNOSIS — M542 Cervicalgia: Secondary | ICD-10-CM | POA: Diagnosis not present

## 2017-06-05 DIAGNOSIS — M9905 Segmental and somatic dysfunction of pelvic region: Secondary | ICD-10-CM | POA: Diagnosis not present

## 2017-06-05 DIAGNOSIS — M9903 Segmental and somatic dysfunction of lumbar region: Secondary | ICD-10-CM | POA: Diagnosis not present

## 2017-06-05 DIAGNOSIS — S336XXA Sprain of sacroiliac joint, initial encounter: Secondary | ICD-10-CM | POA: Diagnosis not present

## 2017-06-11 DIAGNOSIS — E785 Hyperlipidemia, unspecified: Secondary | ICD-10-CM | POA: Diagnosis not present

## 2017-06-11 DIAGNOSIS — R7301 Impaired fasting glucose: Secondary | ICD-10-CM | POA: Diagnosis not present

## 2017-06-11 DIAGNOSIS — M4726 Other spondylosis with radiculopathy, lumbar region: Secondary | ICD-10-CM | POA: Diagnosis not present

## 2017-06-11 DIAGNOSIS — F329 Major depressive disorder, single episode, unspecified: Secondary | ICD-10-CM | POA: Diagnosis not present

## 2017-06-11 DIAGNOSIS — Z23 Encounter for immunization: Secondary | ICD-10-CM | POA: Diagnosis not present

## 2017-06-11 DIAGNOSIS — E559 Vitamin D deficiency, unspecified: Secondary | ICD-10-CM | POA: Diagnosis not present

## 2017-06-11 DIAGNOSIS — I10 Essential (primary) hypertension: Secondary | ICD-10-CM | POA: Diagnosis not present

## 2017-06-11 DIAGNOSIS — Z6834 Body mass index (BMI) 34.0-34.9, adult: Secondary | ICD-10-CM | POA: Diagnosis not present

## 2017-06-12 DIAGNOSIS — M545 Low back pain: Secondary | ICD-10-CM | POA: Diagnosis not present

## 2017-06-12 DIAGNOSIS — M542 Cervicalgia: Secondary | ICD-10-CM | POA: Diagnosis not present

## 2017-06-12 DIAGNOSIS — M5136 Other intervertebral disc degeneration, lumbar region: Secondary | ICD-10-CM | POA: Diagnosis not present

## 2017-06-12 DIAGNOSIS — M9901 Segmental and somatic dysfunction of cervical region: Secondary | ICD-10-CM | POA: Diagnosis not present

## 2017-06-12 DIAGNOSIS — M1611 Unilateral primary osteoarthritis, right hip: Secondary | ICD-10-CM | POA: Diagnosis not present

## 2017-06-12 DIAGNOSIS — M47816 Spondylosis without myelopathy or radiculopathy, lumbar region: Secondary | ICD-10-CM | POA: Diagnosis not present

## 2017-06-12 DIAGNOSIS — S336XXA Sprain of sacroiliac joint, initial encounter: Secondary | ICD-10-CM | POA: Diagnosis not present

## 2017-06-12 DIAGNOSIS — B1920 Unspecified viral hepatitis C without hepatic coma: Secondary | ICD-10-CM | POA: Diagnosis not present

## 2017-06-12 DIAGNOSIS — F329 Major depressive disorder, single episode, unspecified: Secondary | ICD-10-CM | POA: Diagnosis not present

## 2017-06-12 DIAGNOSIS — M9905 Segmental and somatic dysfunction of pelvic region: Secondary | ICD-10-CM | POA: Diagnosis not present

## 2017-06-12 DIAGNOSIS — M9902 Segmental and somatic dysfunction of thoracic region: Secondary | ICD-10-CM | POA: Diagnosis not present

## 2017-06-12 DIAGNOSIS — M7062 Trochanteric bursitis, left hip: Secondary | ICD-10-CM | POA: Diagnosis not present

## 2017-06-12 DIAGNOSIS — G894 Chronic pain syndrome: Secondary | ICD-10-CM | POA: Diagnosis not present

## 2017-06-12 DIAGNOSIS — M9903 Segmental and somatic dysfunction of lumbar region: Secondary | ICD-10-CM | POA: Diagnosis not present

## 2017-06-19 DIAGNOSIS — S336XXA Sprain of sacroiliac joint, initial encounter: Secondary | ICD-10-CM | POA: Diagnosis not present

## 2017-06-19 DIAGNOSIS — M9902 Segmental and somatic dysfunction of thoracic region: Secondary | ICD-10-CM | POA: Diagnosis not present

## 2017-06-19 DIAGNOSIS — M5136 Other intervertebral disc degeneration, lumbar region: Secondary | ICD-10-CM | POA: Diagnosis not present

## 2017-06-19 DIAGNOSIS — M9905 Segmental and somatic dysfunction of pelvic region: Secondary | ICD-10-CM | POA: Diagnosis not present

## 2017-06-19 DIAGNOSIS — M9901 Segmental and somatic dysfunction of cervical region: Secondary | ICD-10-CM | POA: Diagnosis not present

## 2017-06-19 DIAGNOSIS — M542 Cervicalgia: Secondary | ICD-10-CM | POA: Diagnosis not present

## 2017-06-19 DIAGNOSIS — M545 Low back pain: Secondary | ICD-10-CM | POA: Diagnosis not present

## 2017-06-19 DIAGNOSIS — M9903 Segmental and somatic dysfunction of lumbar region: Secondary | ICD-10-CM | POA: Diagnosis not present

## 2017-06-26 DIAGNOSIS — M9901 Segmental and somatic dysfunction of cervical region: Secondary | ICD-10-CM | POA: Diagnosis not present

## 2017-06-26 DIAGNOSIS — M5136 Other intervertebral disc degeneration, lumbar region: Secondary | ICD-10-CM | POA: Diagnosis not present

## 2017-06-26 DIAGNOSIS — S336XXA Sprain of sacroiliac joint, initial encounter: Secondary | ICD-10-CM | POA: Diagnosis not present

## 2017-06-26 DIAGNOSIS — M545 Low back pain: Secondary | ICD-10-CM | POA: Diagnosis not present

## 2017-06-26 DIAGNOSIS — M542 Cervicalgia: Secondary | ICD-10-CM | POA: Diagnosis not present

## 2017-06-26 DIAGNOSIS — M9905 Segmental and somatic dysfunction of pelvic region: Secondary | ICD-10-CM | POA: Diagnosis not present

## 2017-06-26 DIAGNOSIS — M9903 Segmental and somatic dysfunction of lumbar region: Secondary | ICD-10-CM | POA: Diagnosis not present

## 2017-06-26 DIAGNOSIS — M9902 Segmental and somatic dysfunction of thoracic region: Secondary | ICD-10-CM | POA: Diagnosis not present

## 2017-07-03 DIAGNOSIS — S336XXA Sprain of sacroiliac joint, initial encounter: Secondary | ICD-10-CM | POA: Diagnosis not present

## 2017-07-03 DIAGNOSIS — M9902 Segmental and somatic dysfunction of thoracic region: Secondary | ICD-10-CM | POA: Diagnosis not present

## 2017-07-03 DIAGNOSIS — M9903 Segmental and somatic dysfunction of lumbar region: Secondary | ICD-10-CM | POA: Diagnosis not present

## 2017-07-03 DIAGNOSIS — M9905 Segmental and somatic dysfunction of pelvic region: Secondary | ICD-10-CM | POA: Diagnosis not present

## 2017-07-03 DIAGNOSIS — M542 Cervicalgia: Secondary | ICD-10-CM | POA: Diagnosis not present

## 2017-07-03 DIAGNOSIS — M545 Low back pain: Secondary | ICD-10-CM | POA: Diagnosis not present

## 2017-07-03 DIAGNOSIS — M9901 Segmental and somatic dysfunction of cervical region: Secondary | ICD-10-CM | POA: Diagnosis not present

## 2017-07-03 DIAGNOSIS — M5136 Other intervertebral disc degeneration, lumbar region: Secondary | ICD-10-CM | POA: Diagnosis not present

## 2017-07-08 DIAGNOSIS — M5136 Other intervertebral disc degeneration, lumbar region: Secondary | ICD-10-CM | POA: Diagnosis not present

## 2017-07-08 DIAGNOSIS — B1920 Unspecified viral hepatitis C without hepatic coma: Secondary | ICD-10-CM | POA: Diagnosis not present

## 2017-07-08 DIAGNOSIS — M47816 Spondylosis without myelopathy or radiculopathy, lumbar region: Secondary | ICD-10-CM | POA: Diagnosis not present

## 2017-07-08 DIAGNOSIS — M1611 Unilateral primary osteoarthritis, right hip: Secondary | ICD-10-CM | POA: Diagnosis not present

## 2017-07-08 DIAGNOSIS — G894 Chronic pain syndrome: Secondary | ICD-10-CM | POA: Diagnosis not present

## 2017-07-08 DIAGNOSIS — F329 Major depressive disorder, single episode, unspecified: Secondary | ICD-10-CM | POA: Diagnosis not present

## 2017-07-08 DIAGNOSIS — M7062 Trochanteric bursitis, left hip: Secondary | ICD-10-CM | POA: Diagnosis not present

## 2017-07-17 DIAGNOSIS — M5136 Other intervertebral disc degeneration, lumbar region: Secondary | ICD-10-CM | POA: Diagnosis not present

## 2017-07-17 DIAGNOSIS — M545 Low back pain: Secondary | ICD-10-CM | POA: Diagnosis not present

## 2017-07-17 DIAGNOSIS — M9901 Segmental and somatic dysfunction of cervical region: Secondary | ICD-10-CM | POA: Diagnosis not present

## 2017-07-17 DIAGNOSIS — M542 Cervicalgia: Secondary | ICD-10-CM | POA: Diagnosis not present

## 2017-07-17 DIAGNOSIS — M9902 Segmental and somatic dysfunction of thoracic region: Secondary | ICD-10-CM | POA: Diagnosis not present

## 2017-07-17 DIAGNOSIS — S336XXA Sprain of sacroiliac joint, initial encounter: Secondary | ICD-10-CM | POA: Diagnosis not present

## 2017-07-17 DIAGNOSIS — M9905 Segmental and somatic dysfunction of pelvic region: Secondary | ICD-10-CM | POA: Diagnosis not present

## 2017-07-17 DIAGNOSIS — M9903 Segmental and somatic dysfunction of lumbar region: Secondary | ICD-10-CM | POA: Diagnosis not present

## 2017-08-06 DIAGNOSIS — M47816 Spondylosis without myelopathy or radiculopathy, lumbar region: Secondary | ICD-10-CM | POA: Diagnosis not present

## 2017-08-06 DIAGNOSIS — M7062 Trochanteric bursitis, left hip: Secondary | ICD-10-CM | POA: Diagnosis not present

## 2017-08-06 DIAGNOSIS — B1920 Unspecified viral hepatitis C without hepatic coma: Secondary | ICD-10-CM | POA: Diagnosis not present

## 2017-08-06 DIAGNOSIS — F329 Major depressive disorder, single episode, unspecified: Secondary | ICD-10-CM | POA: Diagnosis not present

## 2017-08-06 DIAGNOSIS — G894 Chronic pain syndrome: Secondary | ICD-10-CM | POA: Diagnosis not present

## 2017-08-06 DIAGNOSIS — M1611 Unilateral primary osteoarthritis, right hip: Secondary | ICD-10-CM | POA: Diagnosis not present

## 2017-08-06 DIAGNOSIS — M5136 Other intervertebral disc degeneration, lumbar region: Secondary | ICD-10-CM | POA: Diagnosis not present

## 2017-08-07 DIAGNOSIS — M9901 Segmental and somatic dysfunction of cervical region: Secondary | ICD-10-CM | POA: Diagnosis not present

## 2017-08-07 DIAGNOSIS — M9905 Segmental and somatic dysfunction of pelvic region: Secondary | ICD-10-CM | POA: Diagnosis not present

## 2017-08-07 DIAGNOSIS — M9903 Segmental and somatic dysfunction of lumbar region: Secondary | ICD-10-CM | POA: Diagnosis not present

## 2017-08-07 DIAGNOSIS — M545 Low back pain: Secondary | ICD-10-CM | POA: Diagnosis not present

## 2017-08-07 DIAGNOSIS — M5136 Other intervertebral disc degeneration, lumbar region: Secondary | ICD-10-CM | POA: Diagnosis not present

## 2017-08-07 DIAGNOSIS — M9902 Segmental and somatic dysfunction of thoracic region: Secondary | ICD-10-CM | POA: Diagnosis not present

## 2017-08-07 DIAGNOSIS — M542 Cervicalgia: Secondary | ICD-10-CM | POA: Diagnosis not present

## 2017-08-07 DIAGNOSIS — S336XXA Sprain of sacroiliac joint, initial encounter: Secondary | ICD-10-CM | POA: Diagnosis not present

## 2017-08-12 DIAGNOSIS — Z79899 Other long term (current) drug therapy: Secondary | ICD-10-CM | POA: Diagnosis not present

## 2017-08-12 DIAGNOSIS — K732 Chronic active hepatitis, not elsewhere classified: Secondary | ICD-10-CM | POA: Diagnosis not present

## 2017-08-21 DIAGNOSIS — M542 Cervicalgia: Secondary | ICD-10-CM | POA: Diagnosis not present

## 2017-08-21 DIAGNOSIS — M9905 Segmental and somatic dysfunction of pelvic region: Secondary | ICD-10-CM | POA: Diagnosis not present

## 2017-08-21 DIAGNOSIS — M545 Low back pain: Secondary | ICD-10-CM | POA: Diagnosis not present

## 2017-08-21 DIAGNOSIS — M9901 Segmental and somatic dysfunction of cervical region: Secondary | ICD-10-CM | POA: Diagnosis not present

## 2017-08-21 DIAGNOSIS — S336XXA Sprain of sacroiliac joint, initial encounter: Secondary | ICD-10-CM | POA: Diagnosis not present

## 2017-08-21 DIAGNOSIS — M9902 Segmental and somatic dysfunction of thoracic region: Secondary | ICD-10-CM | POA: Diagnosis not present

## 2017-08-21 DIAGNOSIS — M9903 Segmental and somatic dysfunction of lumbar region: Secondary | ICD-10-CM | POA: Diagnosis not present

## 2017-08-21 DIAGNOSIS — M5136 Other intervertebral disc degeneration, lumbar region: Secondary | ICD-10-CM | POA: Diagnosis not present

## 2017-09-04 DIAGNOSIS — M5136 Other intervertebral disc degeneration, lumbar region: Secondary | ICD-10-CM | POA: Diagnosis not present

## 2017-09-04 DIAGNOSIS — M47816 Spondylosis without myelopathy or radiculopathy, lumbar region: Secondary | ICD-10-CM | POA: Diagnosis not present

## 2017-09-04 DIAGNOSIS — G894 Chronic pain syndrome: Secondary | ICD-10-CM | POA: Diagnosis not present

## 2017-09-04 DIAGNOSIS — F329 Major depressive disorder, single episode, unspecified: Secondary | ICD-10-CM | POA: Diagnosis not present

## 2017-09-04 DIAGNOSIS — M1611 Unilateral primary osteoarthritis, right hip: Secondary | ICD-10-CM | POA: Diagnosis not present

## 2017-09-04 DIAGNOSIS — M7062 Trochanteric bursitis, left hip: Secondary | ICD-10-CM | POA: Diagnosis not present

## 2017-09-04 DIAGNOSIS — B1920 Unspecified viral hepatitis C without hepatic coma: Secondary | ICD-10-CM | POA: Diagnosis not present

## 2017-10-02 DIAGNOSIS — B1920 Unspecified viral hepatitis C without hepatic coma: Secondary | ICD-10-CM | POA: Diagnosis not present

## 2017-10-02 DIAGNOSIS — G894 Chronic pain syndrome: Secondary | ICD-10-CM | POA: Diagnosis not present

## 2017-10-02 DIAGNOSIS — M7062 Trochanteric bursitis, left hip: Secondary | ICD-10-CM | POA: Diagnosis not present

## 2017-10-02 DIAGNOSIS — M1611 Unilateral primary osteoarthritis, right hip: Secondary | ICD-10-CM | POA: Diagnosis not present

## 2017-10-02 DIAGNOSIS — M5136 Other intervertebral disc degeneration, lumbar region: Secondary | ICD-10-CM | POA: Diagnosis not present

## 2017-10-02 DIAGNOSIS — F329 Major depressive disorder, single episode, unspecified: Secondary | ICD-10-CM | POA: Diagnosis not present

## 2017-10-02 DIAGNOSIS — M47816 Spondylosis without myelopathy or radiculopathy, lumbar region: Secondary | ICD-10-CM | POA: Diagnosis not present

## 2017-10-14 DIAGNOSIS — J208 Acute bronchitis due to other specified organisms: Secondary | ICD-10-CM | POA: Diagnosis not present

## 2017-10-14 DIAGNOSIS — Z6835 Body mass index (BMI) 35.0-35.9, adult: Secondary | ICD-10-CM | POA: Diagnosis not present

## 2017-10-30 DIAGNOSIS — M47816 Spondylosis without myelopathy or radiculopathy, lumbar region: Secondary | ICD-10-CM | POA: Diagnosis not present

## 2017-10-30 DIAGNOSIS — M1611 Unilateral primary osteoarthritis, right hip: Secondary | ICD-10-CM | POA: Diagnosis not present

## 2017-10-30 DIAGNOSIS — M5136 Other intervertebral disc degeneration, lumbar region: Secondary | ICD-10-CM | POA: Diagnosis not present

## 2017-10-30 DIAGNOSIS — G894 Chronic pain syndrome: Secondary | ICD-10-CM | POA: Diagnosis not present

## 2017-10-30 DIAGNOSIS — B1920 Unspecified viral hepatitis C without hepatic coma: Secondary | ICD-10-CM | POA: Diagnosis not present

## 2017-10-30 DIAGNOSIS — M7062 Trochanteric bursitis, left hip: Secondary | ICD-10-CM | POA: Diagnosis not present

## 2017-10-30 DIAGNOSIS — F329 Major depressive disorder, single episode, unspecified: Secondary | ICD-10-CM | POA: Diagnosis not present

## 2017-12-04 DIAGNOSIS — M5136 Other intervertebral disc degeneration, lumbar region: Secondary | ICD-10-CM | POA: Diagnosis not present

## 2017-12-04 DIAGNOSIS — M1611 Unilateral primary osteoarthritis, right hip: Secondary | ICD-10-CM | POA: Diagnosis not present

## 2017-12-04 DIAGNOSIS — M7062 Trochanteric bursitis, left hip: Secondary | ICD-10-CM | POA: Diagnosis not present

## 2017-12-04 DIAGNOSIS — F329 Major depressive disorder, single episode, unspecified: Secondary | ICD-10-CM | POA: Diagnosis not present

## 2017-12-04 DIAGNOSIS — G894 Chronic pain syndrome: Secondary | ICD-10-CM | POA: Diagnosis not present

## 2017-12-04 DIAGNOSIS — B1920 Unspecified viral hepatitis C without hepatic coma: Secondary | ICD-10-CM | POA: Diagnosis not present

## 2017-12-04 DIAGNOSIS — M47816 Spondylosis without myelopathy or radiculopathy, lumbar region: Secondary | ICD-10-CM | POA: Diagnosis not present

## 2017-12-09 DIAGNOSIS — M9905 Segmental and somatic dysfunction of pelvic region: Secondary | ICD-10-CM | POA: Diagnosis not present

## 2017-12-09 DIAGNOSIS — M9903 Segmental and somatic dysfunction of lumbar region: Secondary | ICD-10-CM | POA: Diagnosis not present

## 2017-12-09 DIAGNOSIS — M5136 Other intervertebral disc degeneration, lumbar region: Secondary | ICD-10-CM | POA: Diagnosis not present

## 2017-12-09 DIAGNOSIS — M9902 Segmental and somatic dysfunction of thoracic region: Secondary | ICD-10-CM | POA: Diagnosis not present

## 2017-12-09 DIAGNOSIS — S336XXA Sprain of sacroiliac joint, initial encounter: Secondary | ICD-10-CM | POA: Diagnosis not present

## 2017-12-09 DIAGNOSIS — M9901 Segmental and somatic dysfunction of cervical region: Secondary | ICD-10-CM | POA: Diagnosis not present

## 2017-12-09 DIAGNOSIS — M545 Low back pain: Secondary | ICD-10-CM | POA: Diagnosis not present

## 2017-12-09 DIAGNOSIS — M542 Cervicalgia: Secondary | ICD-10-CM | POA: Diagnosis not present

## 2017-12-11 DIAGNOSIS — S336XXA Sprain of sacroiliac joint, initial encounter: Secondary | ICD-10-CM | POA: Diagnosis not present

## 2017-12-11 DIAGNOSIS — M9903 Segmental and somatic dysfunction of lumbar region: Secondary | ICD-10-CM | POA: Diagnosis not present

## 2017-12-11 DIAGNOSIS — M542 Cervicalgia: Secondary | ICD-10-CM | POA: Diagnosis not present

## 2017-12-11 DIAGNOSIS — M5136 Other intervertebral disc degeneration, lumbar region: Secondary | ICD-10-CM | POA: Diagnosis not present

## 2017-12-11 DIAGNOSIS — M545 Low back pain: Secondary | ICD-10-CM | POA: Diagnosis not present

## 2017-12-11 DIAGNOSIS — M9902 Segmental and somatic dysfunction of thoracic region: Secondary | ICD-10-CM | POA: Diagnosis not present

## 2017-12-11 DIAGNOSIS — M9901 Segmental and somatic dysfunction of cervical region: Secondary | ICD-10-CM | POA: Diagnosis not present

## 2017-12-11 DIAGNOSIS — M9905 Segmental and somatic dysfunction of pelvic region: Secondary | ICD-10-CM | POA: Diagnosis not present

## 2017-12-13 DIAGNOSIS — R7301 Impaired fasting glucose: Secondary | ICD-10-CM | POA: Diagnosis not present

## 2017-12-13 DIAGNOSIS — E785 Hyperlipidemia, unspecified: Secondary | ICD-10-CM | POA: Diagnosis not present

## 2017-12-13 DIAGNOSIS — E559 Vitamin D deficiency, unspecified: Secondary | ICD-10-CM | POA: Diagnosis not present

## 2017-12-13 DIAGNOSIS — I10 Essential (primary) hypertension: Secondary | ICD-10-CM | POA: Diagnosis not present

## 2017-12-16 DIAGNOSIS — M9905 Segmental and somatic dysfunction of pelvic region: Secondary | ICD-10-CM | POA: Diagnosis not present

## 2017-12-16 DIAGNOSIS — M9903 Segmental and somatic dysfunction of lumbar region: Secondary | ICD-10-CM | POA: Diagnosis not present

## 2017-12-16 DIAGNOSIS — M542 Cervicalgia: Secondary | ICD-10-CM | POA: Diagnosis not present

## 2017-12-16 DIAGNOSIS — M9902 Segmental and somatic dysfunction of thoracic region: Secondary | ICD-10-CM | POA: Diagnosis not present

## 2017-12-16 DIAGNOSIS — M5136 Other intervertebral disc degeneration, lumbar region: Secondary | ICD-10-CM | POA: Diagnosis not present

## 2017-12-16 DIAGNOSIS — M9901 Segmental and somatic dysfunction of cervical region: Secondary | ICD-10-CM | POA: Diagnosis not present

## 2017-12-16 DIAGNOSIS — M545 Low back pain: Secondary | ICD-10-CM | POA: Diagnosis not present

## 2017-12-16 DIAGNOSIS — S336XXA Sprain of sacroiliac joint, initial encounter: Secondary | ICD-10-CM | POA: Diagnosis not present

## 2017-12-23 DIAGNOSIS — M9903 Segmental and somatic dysfunction of lumbar region: Secondary | ICD-10-CM | POA: Diagnosis not present

## 2017-12-23 DIAGNOSIS — M5136 Other intervertebral disc degeneration, lumbar region: Secondary | ICD-10-CM | POA: Diagnosis not present

## 2017-12-23 DIAGNOSIS — M9901 Segmental and somatic dysfunction of cervical region: Secondary | ICD-10-CM | POA: Diagnosis not present

## 2017-12-23 DIAGNOSIS — M9902 Segmental and somatic dysfunction of thoracic region: Secondary | ICD-10-CM | POA: Diagnosis not present

## 2017-12-23 DIAGNOSIS — M545 Low back pain: Secondary | ICD-10-CM | POA: Diagnosis not present

## 2017-12-23 DIAGNOSIS — M542 Cervicalgia: Secondary | ICD-10-CM | POA: Diagnosis not present

## 2017-12-23 DIAGNOSIS — S336XXA Sprain of sacroiliac joint, initial encounter: Secondary | ICD-10-CM | POA: Diagnosis not present

## 2017-12-23 DIAGNOSIS — M9905 Segmental and somatic dysfunction of pelvic region: Secondary | ICD-10-CM | POA: Diagnosis not present

## 2017-12-25 DIAGNOSIS — M9901 Segmental and somatic dysfunction of cervical region: Secondary | ICD-10-CM | POA: Diagnosis not present

## 2017-12-25 DIAGNOSIS — S336XXA Sprain of sacroiliac joint, initial encounter: Secondary | ICD-10-CM | POA: Diagnosis not present

## 2017-12-25 DIAGNOSIS — M9903 Segmental and somatic dysfunction of lumbar region: Secondary | ICD-10-CM | POA: Diagnosis not present

## 2017-12-25 DIAGNOSIS — M5136 Other intervertebral disc degeneration, lumbar region: Secondary | ICD-10-CM | POA: Diagnosis not present

## 2017-12-25 DIAGNOSIS — M545 Low back pain: Secondary | ICD-10-CM | POA: Diagnosis not present

## 2017-12-25 DIAGNOSIS — M9902 Segmental and somatic dysfunction of thoracic region: Secondary | ICD-10-CM | POA: Diagnosis not present

## 2017-12-25 DIAGNOSIS — M542 Cervicalgia: Secondary | ICD-10-CM | POA: Diagnosis not present

## 2017-12-25 DIAGNOSIS — M9905 Segmental and somatic dysfunction of pelvic region: Secondary | ICD-10-CM | POA: Diagnosis not present

## 2018-01-01 DIAGNOSIS — M9902 Segmental and somatic dysfunction of thoracic region: Secondary | ICD-10-CM | POA: Diagnosis not present

## 2018-01-01 DIAGNOSIS — M542 Cervicalgia: Secondary | ICD-10-CM | POA: Diagnosis not present

## 2018-01-01 DIAGNOSIS — M5136 Other intervertebral disc degeneration, lumbar region: Secondary | ICD-10-CM | POA: Diagnosis not present

## 2018-01-01 DIAGNOSIS — B1920 Unspecified viral hepatitis C without hepatic coma: Secondary | ICD-10-CM | POA: Diagnosis not present

## 2018-01-01 DIAGNOSIS — M47816 Spondylosis without myelopathy or radiculopathy, lumbar region: Secondary | ICD-10-CM | POA: Diagnosis not present

## 2018-01-01 DIAGNOSIS — M545 Low back pain: Secondary | ICD-10-CM | POA: Diagnosis not present

## 2018-01-01 DIAGNOSIS — F329 Major depressive disorder, single episode, unspecified: Secondary | ICD-10-CM | POA: Diagnosis not present

## 2018-01-01 DIAGNOSIS — M9903 Segmental and somatic dysfunction of lumbar region: Secondary | ICD-10-CM | POA: Diagnosis not present

## 2018-01-01 DIAGNOSIS — M1611 Unilateral primary osteoarthritis, right hip: Secondary | ICD-10-CM | POA: Diagnosis not present

## 2018-01-01 DIAGNOSIS — M9901 Segmental and somatic dysfunction of cervical region: Secondary | ICD-10-CM | POA: Diagnosis not present

## 2018-01-01 DIAGNOSIS — M7062 Trochanteric bursitis, left hip: Secondary | ICD-10-CM | POA: Diagnosis not present

## 2018-01-01 DIAGNOSIS — M9905 Segmental and somatic dysfunction of pelvic region: Secondary | ICD-10-CM | POA: Diagnosis not present

## 2018-01-01 DIAGNOSIS — G894 Chronic pain syndrome: Secondary | ICD-10-CM | POA: Diagnosis not present

## 2018-01-01 DIAGNOSIS — S336XXA Sprain of sacroiliac joint, initial encounter: Secondary | ICD-10-CM | POA: Diagnosis not present

## 2018-01-08 DIAGNOSIS — M542 Cervicalgia: Secondary | ICD-10-CM | POA: Diagnosis not present

## 2018-01-08 DIAGNOSIS — M9902 Segmental and somatic dysfunction of thoracic region: Secondary | ICD-10-CM | POA: Diagnosis not present

## 2018-01-08 DIAGNOSIS — M545 Low back pain: Secondary | ICD-10-CM | POA: Diagnosis not present

## 2018-01-08 DIAGNOSIS — M9905 Segmental and somatic dysfunction of pelvic region: Secondary | ICD-10-CM | POA: Diagnosis not present

## 2018-01-08 DIAGNOSIS — M9901 Segmental and somatic dysfunction of cervical region: Secondary | ICD-10-CM | POA: Diagnosis not present

## 2018-01-08 DIAGNOSIS — M9903 Segmental and somatic dysfunction of lumbar region: Secondary | ICD-10-CM | POA: Diagnosis not present

## 2018-01-08 DIAGNOSIS — S336XXA Sprain of sacroiliac joint, initial encounter: Secondary | ICD-10-CM | POA: Diagnosis not present

## 2018-01-08 DIAGNOSIS — M5136 Other intervertebral disc degeneration, lumbar region: Secondary | ICD-10-CM | POA: Diagnosis not present

## 2018-01-22 DIAGNOSIS — M545 Low back pain: Secondary | ICD-10-CM | POA: Diagnosis not present

## 2018-01-22 DIAGNOSIS — M9901 Segmental and somatic dysfunction of cervical region: Secondary | ICD-10-CM | POA: Diagnosis not present

## 2018-01-22 DIAGNOSIS — M542 Cervicalgia: Secondary | ICD-10-CM | POA: Diagnosis not present

## 2018-01-22 DIAGNOSIS — M9903 Segmental and somatic dysfunction of lumbar region: Secondary | ICD-10-CM | POA: Diagnosis not present

## 2018-01-22 DIAGNOSIS — M9905 Segmental and somatic dysfunction of pelvic region: Secondary | ICD-10-CM | POA: Diagnosis not present

## 2018-01-22 DIAGNOSIS — S336XXA Sprain of sacroiliac joint, initial encounter: Secondary | ICD-10-CM | POA: Diagnosis not present

## 2018-01-22 DIAGNOSIS — M9902 Segmental and somatic dysfunction of thoracic region: Secondary | ICD-10-CM | POA: Diagnosis not present

## 2018-01-22 DIAGNOSIS — M5136 Other intervertebral disc degeneration, lumbar region: Secondary | ICD-10-CM | POA: Diagnosis not present

## 2018-01-29 DIAGNOSIS — F329 Major depressive disorder, single episode, unspecified: Secondary | ICD-10-CM | POA: Diagnosis not present

## 2018-01-29 DIAGNOSIS — B1920 Unspecified viral hepatitis C without hepatic coma: Secondary | ICD-10-CM | POA: Diagnosis not present

## 2018-01-29 DIAGNOSIS — M5136 Other intervertebral disc degeneration, lumbar region: Secondary | ICD-10-CM | POA: Diagnosis not present

## 2018-01-29 DIAGNOSIS — G894 Chronic pain syndrome: Secondary | ICD-10-CM | POA: Diagnosis not present

## 2018-01-29 DIAGNOSIS — M47816 Spondylosis without myelopathy or radiculopathy, lumbar region: Secondary | ICD-10-CM | POA: Diagnosis not present

## 2018-01-29 DIAGNOSIS — M1611 Unilateral primary osteoarthritis, right hip: Secondary | ICD-10-CM | POA: Diagnosis not present

## 2018-01-29 DIAGNOSIS — M7062 Trochanteric bursitis, left hip: Secondary | ICD-10-CM | POA: Diagnosis not present

## 2018-02-05 DIAGNOSIS — M9901 Segmental and somatic dysfunction of cervical region: Secondary | ICD-10-CM | POA: Diagnosis not present

## 2018-02-05 DIAGNOSIS — M9905 Segmental and somatic dysfunction of pelvic region: Secondary | ICD-10-CM | POA: Diagnosis not present

## 2018-02-05 DIAGNOSIS — S336XXA Sprain of sacroiliac joint, initial encounter: Secondary | ICD-10-CM | POA: Diagnosis not present

## 2018-02-05 DIAGNOSIS — M9902 Segmental and somatic dysfunction of thoracic region: Secondary | ICD-10-CM | POA: Diagnosis not present

## 2018-02-05 DIAGNOSIS — M542 Cervicalgia: Secondary | ICD-10-CM | POA: Diagnosis not present

## 2018-02-05 DIAGNOSIS — M545 Low back pain: Secondary | ICD-10-CM | POA: Diagnosis not present

## 2018-02-05 DIAGNOSIS — M9903 Segmental and somatic dysfunction of lumbar region: Secondary | ICD-10-CM | POA: Diagnosis not present

## 2018-02-05 DIAGNOSIS — M5136 Other intervertebral disc degeneration, lumbar region: Secondary | ICD-10-CM | POA: Diagnosis not present

## 2018-02-14 DIAGNOSIS — Z6833 Body mass index (BMI) 33.0-33.9, adult: Secondary | ICD-10-CM | POA: Diagnosis not present

## 2018-02-14 DIAGNOSIS — E669 Obesity, unspecified: Secondary | ICD-10-CM | POA: Diagnosis not present

## 2018-02-14 DIAGNOSIS — Z0000 Encounter for general adult medical examination without abnormal findings: Secondary | ICD-10-CM | POA: Diagnosis not present

## 2018-02-21 DIAGNOSIS — Z1231 Encounter for screening mammogram for malignant neoplasm of breast: Secondary | ICD-10-CM | POA: Diagnosis not present

## 2018-02-24 DIAGNOSIS — Z9181 History of falling: Secondary | ICD-10-CM | POA: Diagnosis not present

## 2018-02-24 DIAGNOSIS — E785 Hyperlipidemia, unspecified: Secondary | ICD-10-CM | POA: Diagnosis not present

## 2018-02-24 DIAGNOSIS — Z136 Encounter for screening for cardiovascular disorders: Secondary | ICD-10-CM | POA: Diagnosis not present

## 2018-02-24 DIAGNOSIS — Z0000 Encounter for general adult medical examination without abnormal findings: Secondary | ICD-10-CM | POA: Diagnosis not present

## 2018-02-24 DIAGNOSIS — Z1331 Encounter for screening for depression: Secondary | ICD-10-CM | POA: Diagnosis not present

## 2018-02-24 DIAGNOSIS — E669 Obesity, unspecified: Secondary | ICD-10-CM | POA: Diagnosis not present

## 2018-02-24 DIAGNOSIS — Z6833 Body mass index (BMI) 33.0-33.9, adult: Secondary | ICD-10-CM | POA: Diagnosis not present

## 2018-02-26 DIAGNOSIS — B1920 Unspecified viral hepatitis C without hepatic coma: Secondary | ICD-10-CM | POA: Diagnosis not present

## 2018-02-26 DIAGNOSIS — M7062 Trochanteric bursitis, left hip: Secondary | ICD-10-CM | POA: Diagnosis not present

## 2018-02-26 DIAGNOSIS — M1611 Unilateral primary osteoarthritis, right hip: Secondary | ICD-10-CM | POA: Diagnosis not present

## 2018-02-26 DIAGNOSIS — M47816 Spondylosis without myelopathy or radiculopathy, lumbar region: Secondary | ICD-10-CM | POA: Diagnosis not present

## 2018-02-26 DIAGNOSIS — M5136 Other intervertebral disc degeneration, lumbar region: Secondary | ICD-10-CM | POA: Diagnosis not present

## 2018-02-26 DIAGNOSIS — G894 Chronic pain syndrome: Secondary | ICD-10-CM | POA: Diagnosis not present

## 2018-02-26 DIAGNOSIS — F329 Major depressive disorder, single episode, unspecified: Secondary | ICD-10-CM | POA: Diagnosis not present

## 2018-03-26 DIAGNOSIS — M1611 Unilateral primary osteoarthritis, right hip: Secondary | ICD-10-CM | POA: Diagnosis not present

## 2018-03-26 DIAGNOSIS — G894 Chronic pain syndrome: Secondary | ICD-10-CM | POA: Diagnosis not present

## 2018-03-26 DIAGNOSIS — M5136 Other intervertebral disc degeneration, lumbar region: Secondary | ICD-10-CM | POA: Diagnosis not present

## 2018-03-26 DIAGNOSIS — B1920 Unspecified viral hepatitis C without hepatic coma: Secondary | ICD-10-CM | POA: Diagnosis not present

## 2018-03-26 DIAGNOSIS — M7062 Trochanteric bursitis, left hip: Secondary | ICD-10-CM | POA: Diagnosis not present

## 2018-03-26 DIAGNOSIS — M47816 Spondylosis without myelopathy or radiculopathy, lumbar region: Secondary | ICD-10-CM | POA: Diagnosis not present

## 2018-03-26 DIAGNOSIS — F329 Major depressive disorder, single episode, unspecified: Secondary | ICD-10-CM | POA: Diagnosis not present

## 2018-04-23 DIAGNOSIS — M1611 Unilateral primary osteoarthritis, right hip: Secondary | ICD-10-CM | POA: Diagnosis not present

## 2018-04-23 DIAGNOSIS — M5136 Other intervertebral disc degeneration, lumbar region: Secondary | ICD-10-CM | POA: Diagnosis not present

## 2018-04-23 DIAGNOSIS — F329 Major depressive disorder, single episode, unspecified: Secondary | ICD-10-CM | POA: Diagnosis not present

## 2018-04-23 DIAGNOSIS — M47816 Spondylosis without myelopathy or radiculopathy, lumbar region: Secondary | ICD-10-CM | POA: Diagnosis not present

## 2018-04-23 DIAGNOSIS — B1920 Unspecified viral hepatitis C without hepatic coma: Secondary | ICD-10-CM | POA: Diagnosis not present

## 2018-04-23 DIAGNOSIS — M7062 Trochanteric bursitis, left hip: Secondary | ICD-10-CM | POA: Diagnosis not present

## 2018-04-23 DIAGNOSIS — G894 Chronic pain syndrome: Secondary | ICD-10-CM | POA: Diagnosis not present

## 2018-05-29 DIAGNOSIS — F329 Major depressive disorder, single episode, unspecified: Secondary | ICD-10-CM | POA: Diagnosis not present

## 2018-05-29 DIAGNOSIS — E669 Obesity, unspecified: Secondary | ICD-10-CM | POA: Diagnosis not present

## 2018-05-29 DIAGNOSIS — M47816 Spondylosis without myelopathy or radiculopathy, lumbar region: Secondary | ICD-10-CM | POA: Diagnosis not present

## 2018-05-29 DIAGNOSIS — M48061 Spinal stenosis, lumbar region without neurogenic claudication: Secondary | ICD-10-CM | POA: Diagnosis not present

## 2018-05-29 DIAGNOSIS — M7062 Trochanteric bursitis, left hip: Secondary | ICD-10-CM | POA: Diagnosis not present

## 2018-05-29 DIAGNOSIS — M5136 Other intervertebral disc degeneration, lumbar region: Secondary | ICD-10-CM | POA: Diagnosis not present

## 2018-05-29 DIAGNOSIS — M1611 Unilateral primary osteoarthritis, right hip: Secondary | ICD-10-CM | POA: Diagnosis not present

## 2018-05-29 DIAGNOSIS — G894 Chronic pain syndrome: Secondary | ICD-10-CM | POA: Diagnosis not present

## 2018-05-29 DIAGNOSIS — B1920 Unspecified viral hepatitis C without hepatic coma: Secondary | ICD-10-CM | POA: Diagnosis not present

## 2018-06-16 DIAGNOSIS — Z6833 Body mass index (BMI) 33.0-33.9, adult: Secondary | ICD-10-CM | POA: Diagnosis not present

## 2018-06-16 DIAGNOSIS — M4726 Other spondylosis with radiculopathy, lumbar region: Secondary | ICD-10-CM | POA: Diagnosis not present

## 2018-06-16 DIAGNOSIS — E785 Hyperlipidemia, unspecified: Secondary | ICD-10-CM | POA: Diagnosis not present

## 2018-06-16 DIAGNOSIS — R1013 Epigastric pain: Secondary | ICD-10-CM | POA: Diagnosis not present

## 2018-06-16 DIAGNOSIS — F329 Major depressive disorder, single episode, unspecified: Secondary | ICD-10-CM | POA: Diagnosis not present

## 2018-06-16 DIAGNOSIS — I10 Essential (primary) hypertension: Secondary | ICD-10-CM | POA: Diagnosis not present

## 2018-06-16 DIAGNOSIS — R7301 Impaired fasting glucose: Secondary | ICD-10-CM | POA: Diagnosis not present

## 2018-06-16 DIAGNOSIS — E559 Vitamin D deficiency, unspecified: Secondary | ICD-10-CM | POA: Diagnosis not present

## 2018-06-16 DIAGNOSIS — Z23 Encounter for immunization: Secondary | ICD-10-CM | POA: Diagnosis not present

## 2018-06-16 DIAGNOSIS — K581 Irritable bowel syndrome with constipation: Secondary | ICD-10-CM | POA: Diagnosis not present

## 2018-06-25 DIAGNOSIS — R1013 Epigastric pain: Secondary | ICD-10-CM | POA: Diagnosis not present

## 2018-06-25 DIAGNOSIS — K219 Gastro-esophageal reflux disease without esophagitis: Secondary | ICD-10-CM | POA: Diagnosis not present

## 2018-06-26 DIAGNOSIS — M5136 Other intervertebral disc degeneration, lumbar region: Secondary | ICD-10-CM | POA: Diagnosis not present

## 2018-06-26 DIAGNOSIS — M48061 Spinal stenosis, lumbar region without neurogenic claudication: Secondary | ICD-10-CM | POA: Diagnosis not present

## 2018-06-26 DIAGNOSIS — M1611 Unilateral primary osteoarthritis, right hip: Secondary | ICD-10-CM | POA: Diagnosis not present

## 2018-06-26 DIAGNOSIS — E669 Obesity, unspecified: Secondary | ICD-10-CM | POA: Diagnosis not present

## 2018-06-26 DIAGNOSIS — M47816 Spondylosis without myelopathy or radiculopathy, lumbar region: Secondary | ICD-10-CM | POA: Diagnosis not present

## 2018-06-26 DIAGNOSIS — B1920 Unspecified viral hepatitis C without hepatic coma: Secondary | ICD-10-CM | POA: Diagnosis not present

## 2018-06-26 DIAGNOSIS — M7062 Trochanteric bursitis, left hip: Secondary | ICD-10-CM | POA: Diagnosis not present

## 2018-06-26 DIAGNOSIS — G894 Chronic pain syndrome: Secondary | ICD-10-CM | POA: Diagnosis not present

## 2018-06-26 DIAGNOSIS — F329 Major depressive disorder, single episode, unspecified: Secondary | ICD-10-CM | POA: Diagnosis not present

## 2018-07-03 ENCOUNTER — Encounter: Payer: Self-pay | Admitting: Gastroenterology

## 2018-07-14 ENCOUNTER — Encounter: Payer: Self-pay | Admitting: Gastroenterology

## 2018-07-14 ENCOUNTER — Ambulatory Visit (INDEPENDENT_AMBULATORY_CARE_PROVIDER_SITE_OTHER): Payer: PPO | Admitting: Gastroenterology

## 2018-07-14 VITALS — BP 130/88 | HR 81 | Wt 188.1 lb

## 2018-07-14 DIAGNOSIS — R1013 Epigastric pain: Secondary | ICD-10-CM | POA: Diagnosis not present

## 2018-07-14 DIAGNOSIS — K219 Gastro-esophageal reflux disease without esophagitis: Secondary | ICD-10-CM

## 2018-07-14 MED ORDER — PANTOPRAZOLE SODIUM 40 MG PO TBEC: 40.0000 mg | DELAYED_RELEASE_TABLET | Freq: Every day | ORAL | 11 refills | Status: DC

## 2018-07-14 MED ORDER — FAMOTIDINE 40 MG PO TABS: 40.0000 mg | ORAL_TABLET | Freq: Every day | ORAL | 11 refills | Status: DC

## 2018-07-14 NOTE — Progress Notes (Signed)
Chief Complaint: Epigastric pain  Referring Provider:  Dr Delena Bali      ASSESSMENT AND PLAN;   #1. Epigastric pain. D/d PUD, GERD, gastritis, nonulcer dyspepsia, gastroparesis, musculoskeletal etiology, r/o gallbladder or pancreatic problems. #2. GERD Plan: - protonix 40mg  po qAM to continue. - Pepcid 40mg  po qhs to continue. - EGD for further evaluation.  I discussed risks and benefits.  She wishes to proceed. - If neg and still problems, [place] followed by HIDA with EF. - Please obtain previous records (blood work results). - Proceed with CT scan of the abdomen and pelvis if the above work-up is negative and continued problems.    HPI:    Evelyn Gonzales is a 68 y.o. female   With epigastric pain Intermittent, better now since she has been on Protonix and Pepcid. Over the last 6 to 8 weeks Radiated to the back With associated nausea but no vomiting No jaundice dark urine or pale stools  Evaluated by Dr. Olean Ree, had normal labs -we do not have the records at the present time.  Sent to the GI clinic for further evaluation.  Patient does feel significantly better.  No problems currently.  She does have history of IBS with predominant constipation which is much better currently.  On review of her records, ultrasound 06/2011-negative except for right hepatic hemangioma, normal gallbladder. Colonoscopy 05/2017- except for moderate sigmoid diverticulosis.  Repeat in 10 years   Past Medical History:  Diagnosis Date  . Arthritis   . Breast cancer ([place]) 2006  . Depression   . Diverticulosis   . History of colon polyps   . HTN (hypertension)   . Hx of hepatitis C   . IBS (irritable bowel syndrome)   . RLS (restless legs syndrome)     Past Surgical History:  Procedure Laterality Date  . COLONOSCOPY  05/27/2017   Moderate predominanlty sigmoid diverticulosis. OTherwise normal colonoscopy.   Marland Kitchen KNEE SURGERY Right   . PARTIAL HYSTERECTOMY      Family History  Problem  Relation Age of Onset  . Liver cancer Father   . Colon cancer Neg Hx     Social History   Tobacco Use  . Smoking status: Former Smoker    Last attempt to quit: 1998    Years since quitting: 21.8  . Smokeless tobacco: Never Used  Substance Use Topics  . Alcohol use: Yes    Alcohol/week: 7.0 standard drinks    Types: 7 Standard drinks or equivalent per week  . Drug use: Not on file    Current Outpatient Medications  Medication Sig Dispense Refill  . DULoxetine (CYMBALTA) 60 MG capsule Take 60 mg by mouth daily.    . famotidine (PEPCID) 40 MG tablet Take 40 mg by mouth at bedtime.    Marland Kitchen lisinopril (PRINIVIL,ZESTRIL) 20 MG tablet Take 20 mg by mouth daily.    . Oxycodone HCl 10 MG TABS Take 10 mg by mouth every 6 (six) hours as needed.    . pantoprazole (PROTONIX) 40 MG tablet Take 40 mg by mouth daily.    Marland Kitchen triamterene-hydrochlorothiazide (MAXZIDE-25) 37.5-25 MG tablet Take 1 tablet by mouth daily.     No current facility-administered medications for this visit.     Not on File  Review of Systems:  Constitutional: Denies fever, chills, diaphoresis, appetite change and fatigue.  HEENT: Denies photophobia, eye pain, redness, hearing loss, ear pain, congestion, sore throat, rhinorrhea, sneezing, mouth sores, neck pain, neck stiffness and tinnitus.   Respiratory:  Denies SOB, DOE, cough, chest tightness,  and wheezing.   Cardiovascular: Denies chest pain, palpitations and leg swelling.  Genitourinary: Denies dysuria, urgency, frequency, hematuria, flank pain and difficulty urinating.  Musculoskeletal: has myalgias, back pain, joint swelling, arthralgias and  No gait problem.  Skin: No rash.  Neurological: Denies dizziness, seizures, syncope, weakness, light-headedness, numbness and headaches.  Hematological: Denies adenopathy. Easy bruising, personal or family bleeding history  Psychiatric/Behavioral: Has anxiety or depression     Physical Exam:    BP 130/88   Pulse 81   Wt  188 lb 2 oz (85.3 kg)  Filed Weights   07/14/18 1454  Weight: 188 lb 2 oz (85.3 kg)   Constitutional:  Well-developed, in no acute distress. Psychiatric: Normal mood and affect. Behavior is normal. HEENT: Pupils normal.  Conjunctivae are normal. No scleral icterus. Neck supple.  Cardiovascular: Normal rate, regular rhythm. No edema Pulmonary/chest: Effort normal and breath sounds normal. No wheezing, rales or rhonchi. Abdominal: Soft, nondistended. Nontender. Bowel sounds active throughout. There are no masses palpable. No hepatomegaly. Rectal:  defered Neurological: Alert and oriented to person place and time. Skin: Skin is warm and dry. No rashes noted.   Carmell Austria, MD 07/14/2018, 3:03 PM  Cc: Dr Delena Bali

## 2018-07-14 NOTE — Patient Instructions (Signed)
If you are age 65 or older, your body mass index should be between 23-30. Your There is no height or weight on file to calculate BMI. If this is out of the aforementioned range listed, please consider follow up with your Primary Care Provider.  If you are age 86 or younger, your body mass index should be between 19-25. Your There is no height or weight on file to calculate BMI. If this is out of the aformentioned range listed, please consider follow up with your Primary Care Provider.   You have been scheduled for an endoscopy. Please follow written instructions given to you at your visit today. If you use inhalers (even only as needed), please bring them with you on the day of your procedure. Your physician has requested that you go to www.startemmi.com and enter the access code given to you at your visit today. This web site gives a general overview about your procedure. However, you should still follow specific instructions given to you by our office regarding your preparation for the procedure.  We have sent the following medications to your pharmacy for you to pick up at your convenience: Pepcid Protonix  Thank you,  Dr. Jackquline Denmark

## 2018-07-25 DIAGNOSIS — M1611 Unilateral primary osteoarthritis, right hip: Secondary | ICD-10-CM | POA: Diagnosis not present

## 2018-07-25 DIAGNOSIS — B1920 Unspecified viral hepatitis C without hepatic coma: Secondary | ICD-10-CM | POA: Diagnosis not present

## 2018-07-25 DIAGNOSIS — M48061 Spinal stenosis, lumbar region without neurogenic claudication: Secondary | ICD-10-CM | POA: Diagnosis not present

## 2018-07-25 DIAGNOSIS — G894 Chronic pain syndrome: Secondary | ICD-10-CM | POA: Diagnosis not present

## 2018-07-25 DIAGNOSIS — M47816 Spondylosis without myelopathy or radiculopathy, lumbar region: Secondary | ICD-10-CM | POA: Diagnosis not present

## 2018-07-25 DIAGNOSIS — M5136 Other intervertebral disc degeneration, lumbar region: Secondary | ICD-10-CM | POA: Diagnosis not present

## 2018-07-25 DIAGNOSIS — E669 Obesity, unspecified: Secondary | ICD-10-CM | POA: Diagnosis not present

## 2018-07-25 DIAGNOSIS — M7062 Trochanteric bursitis, left hip: Secondary | ICD-10-CM | POA: Diagnosis not present

## 2018-07-25 DIAGNOSIS — F329 Major depressive disorder, single episode, unspecified: Secondary | ICD-10-CM | POA: Diagnosis not present

## 2018-08-11 ENCOUNTER — Encounter: Payer: Self-pay | Admitting: Gastroenterology

## 2018-08-25 ENCOUNTER — Telehealth: Payer: Self-pay | Admitting: Gastroenterology

## 2018-08-25 ENCOUNTER — Encounter: Payer: Self-pay | Admitting: Gastroenterology

## 2018-08-25 ENCOUNTER — Encounter: Payer: PPO | Admitting: Gastroenterology

## 2018-08-25 ENCOUNTER — Ambulatory Visit (AMBULATORY_SURGERY_CENTER): Payer: PPO | Admitting: Gastroenterology

## 2018-08-25 VITALS — BP 135/89 | HR 76 | Temp 98.0°F | Resp 15 | Ht 61.0 in | Wt 188.0 lb

## 2018-08-25 DIAGNOSIS — K208 Other esophagitis: Secondary | ICD-10-CM | POA: Diagnosis not present

## 2018-08-25 DIAGNOSIS — R1013 Epigastric pain: Secondary | ICD-10-CM | POA: Diagnosis not present

## 2018-08-25 DIAGNOSIS — K2990 Gastroduodenitis, unspecified, without bleeding: Secondary | ICD-10-CM

## 2018-08-25 DIAGNOSIS — K3189 Other diseases of stomach and duodenum: Secondary | ICD-10-CM | POA: Diagnosis not present

## 2018-08-25 DIAGNOSIS — K2970 Gastritis, unspecified, without bleeding: Secondary | ICD-10-CM | POA: Diagnosis not present

## 2018-08-25 DIAGNOSIS — K219 Gastro-esophageal reflux disease without esophagitis: Secondary | ICD-10-CM

## 2018-08-25 DIAGNOSIS — K229 Disease of esophagus, unspecified: Secondary | ICD-10-CM

## 2018-08-25 DIAGNOSIS — I10 Essential (primary) hypertension: Secondary | ICD-10-CM | POA: Diagnosis not present

## 2018-08-25 DIAGNOSIS — K2950 Unspecified chronic gastritis without bleeding: Secondary | ICD-10-CM | POA: Diagnosis not present

## 2018-08-25 MED ORDER — SODIUM CHLORIDE 0.9 % IV SOLN: 500.0000 mL | Freq: Once | INTRAVENOUS | Status: DC

## 2018-08-25 NOTE — Telephone Encounter (Signed)
Pt states that she took 12 gummy vitamins at 830 this morning.  She has not had anything since.  She lost her instructions and was confused about whether she could have them or not.  I spoke with Lafe Garin, CRNA and she would not be able to be sedated for procedure until 3:00 pm today.  Patient agrees to coming in at 2 for 3:00 procedure.  Instructed her only have clear liquids until 12:00 noon with no additional solid food.  Also reviewed what was considered a clear liquid.  She verbalized understanding.

## 2018-08-25 NOTE — Op Note (Signed)
[place] Patient Name: Evelyn Gonzales Procedure Date: 08/25/2018 2:51 PM MRN: [id number] Endoscopist: Jackquline Denmark , MD Age: 67 Referring MD:  Date of Birth: [birth year] Gender: Female Account #: [id number] Procedure:                Upper GI endoscopy Indications:              Epigastric abdominal pain Medicines:                Monitored Anesthesia Care Procedure:                Pre-Anesthesia Assessment:                           - Prior to the procedure, a History and Physical                            was performed, and patient medications and                            allergies were reviewed. The patient's tolerance of                            previous anesthesia was also reviewed. The risks                            and benefits of the procedure and the sedation                            options and risks were discussed with the patient.                            All questions were answered, and informed consent                            was obtained. Prior Anticoagulants: The patient has                            taken no previous anticoagulant or antiplatelet                            agents. ASA Grade Assessment: II - A patient with                            mild systemic disease. After reviewing the risks                            and benefits, the patient was deemed in                            satisfactory condition to undergo the procedure.                           After obtaining informed consent, the endoscope was  passed under direct vision. Throughout the                            procedure, the patient's blood pressure, pulse, and                            oxygen saturations were monitored continuously. The                            Model GIF-HQ190 [id number]) scope was introduced                            through the mouth, and advanced to the second part                            of duodenum. The upper GI  endoscopy was                            accomplished without difficulty. The patient                            tolerated the procedure well. Scope In: Scope Out: Findings:                 The Z-line was irregular and was found 35 cm from                            the incisors. Biopsies were taken with a cold                            forceps for histology directed by [facility].                           Localized mild inflammation was found in the                            gastric antrum. Biopsies were taken with a cold                            forceps for histology.                           The examined duodenum was normal. Complications:            No immediate complications. Estimated Blood Loss:     Estimated blood loss: none. Impression:               - Slightly Z-line irregular, 35 cm from the                            incisors. Biopsied.                           - Mild gastritis. Biopsied. Recommendation:           - Resume previous diet.                           -  Continue Protonix 40 mg p.o. every morning and                            Pepcid 20 mg p.o. nightly for now. After 4 weeks,                            can stop Pepcid and continue Protonix.                           - Await pathology results.                           - Return to GI office PRN. Jackquline Denmark, MD 08/25/2018 3:05:22 PM This report has been signed electronically.

## 2018-08-25 NOTE — Telephone Encounter (Signed)
No Please do not charge

## 2018-08-25 NOTE — Progress Notes (Signed)
Report given to PACU, vss 

## 2018-08-25 NOTE — Progress Notes (Signed)
Called to room to assist during endoscopic procedure.  Patient ID and intended procedure confirmed with present staff. Received instructions for my participation in the procedure from the performing physician.  

## 2018-08-25 NOTE — Telephone Encounter (Signed)
PT called and advised that she took her gummy vitiamins this morning

## 2018-08-25 NOTE — Patient Instructions (Signed)
Discharge instructions given. Handout on Gastritis. Biopsies taken. Resume previous medications. YOU HAD AN ENDOSCOPIC PROCEDURE TODAY AT [facility]:   Refer to the procedure report that was given to you for any specific questions about what was found during the examination.  If the procedure report does not answer your questions, please call your gastroenterologist to clarify.  If you requested that your care partner not be given the details of your procedure findings, then the procedure report has been included in a sealed envelope for you to review at your convenience later.  YOU SHOULD EXPECT: Some feelings of bloating in the abdomen. Passage of more gas than usual.  Walking can help get rid of the air that was put into your GI tract during the procedure and reduce the bloating. If you had a lower endoscopy (such as a colonoscopy or flexible sigmoidoscopy) you may notice spotting of blood in your stool or on the toilet paper. If you underwent a bowel prep for your procedure, you may not have a normal bowel movement for a few days.  Please Note:  You might notice some irritation and congestion in your nose or some drainage.  This is from the oxygen used during your procedure.  There is no need for concern and it should clear up in a day or so.  SYMPTOMS TO REPORT IMMEDIATELY:   Following lower endoscopy (colonoscopy or flexible sigmoidoscopy):  Excessive amounts of blood in the stool  Significant tenderness or worsening of abdominal pains  Swelling of the abdomen that is new, acute  Fever of 100F or higher   Following upper endoscopy (EGD)  Vomiting of blood or coffee ground material  New chest pain or pain under the shoulder blades  Painful or persistently difficult swallowing  New shortness of breath  Fever of 100F or higher  Black, tarry-looking stools  For urgent or emergent issues, a gastroenterologist can be reached at any hour by calling [phone]  [phone].   DIET:  We do recommend a small meal at first, but then you may proceed to your regular diet.  Drink plenty of fluids but you should avoid alcoholic beverages for 24 hours.  ACTIVITY:  You should plan to take it easy for the rest of today and you should NOT DRIVE or use heavy machinery until tomorrow (because of the sedation medicines used during the test).    FOLLOW UP: Our staff will call the number listed on your records the next business day following your procedure to check on you and address any questions or concerns that you may have regarding the information given to you following your procedure. If we do not reach you, we will leave a message.  However, if you are feeling well and you are not experiencing any problems, there is no need to return our call.  We will assume that you have returned to your regular daily activities without incident.  If any biopsies were taken you will be contacted by phone or by letter within the next 1-3 weeks.  Please call us at [phone] if you have not heard about the biopsies in 3 weeks.    SIGNATURES/CONFIDENTIALITY: You and/or your care partner have signed paperwork which will be entered into your electronic medical record.  These signatures attest to the fact that that the information above on your After Visit Summary has been reviewed and is understood.  Full responsibility of the confidentiality of this discharge information lies with you and/or your care-partner.

## 2018-08-25 NOTE — Telephone Encounter (Signed)
Patient called answering service today @ 7:46am to cx proc for today. Will you charge?

## 2018-08-25 NOTE — Progress Notes (Signed)
Pt's states no medical or surgical changes since previsit or office visit. 

## 2018-08-26 ENCOUNTER — Telehealth: Payer: Self-pay | Admitting: *Deleted

## 2018-08-26 DIAGNOSIS — E669 Obesity, unspecified: Secondary | ICD-10-CM | POA: Diagnosis not present

## 2018-08-26 DIAGNOSIS — M47816 Spondylosis without myelopathy or radiculopathy, lumbar region: Secondary | ICD-10-CM | POA: Diagnosis not present

## 2018-08-26 DIAGNOSIS — B1920 Unspecified viral hepatitis C without hepatic coma: Secondary | ICD-10-CM | POA: Diagnosis not present

## 2018-08-26 DIAGNOSIS — M7062 Trochanteric bursitis, left hip: Secondary | ICD-10-CM | POA: Diagnosis not present

## 2018-08-26 DIAGNOSIS — M5136 Other intervertebral disc degeneration, lumbar region: Secondary | ICD-10-CM | POA: Diagnosis not present

## 2018-08-26 DIAGNOSIS — F329 Major depressive disorder, single episode, unspecified: Secondary | ICD-10-CM | POA: Diagnosis not present

## 2018-08-26 DIAGNOSIS — M1611 Unilateral primary osteoarthritis, right hip: Secondary | ICD-10-CM | POA: Diagnosis not present

## 2018-08-26 DIAGNOSIS — M48061 Spinal stenosis, lumbar region without neurogenic claudication: Secondary | ICD-10-CM | POA: Diagnosis not present

## 2018-08-26 DIAGNOSIS — M461 Sacroiliitis, not elsewhere classified: Secondary | ICD-10-CM | POA: Diagnosis not present

## 2018-08-26 DIAGNOSIS — G894 Chronic pain syndrome: Secondary | ICD-10-CM | POA: Diagnosis not present

## 2018-08-26 NOTE — Telephone Encounter (Signed)
  Follow up Call-  Call back number 08/25/2018  Post procedure Call Back phone  # [phone]  Permission to leave phone message Yes  Some recent data might be hidden     Patient questions:  Do you have a fever, pain , or abdominal swelling? No. Pain Score  0 *  Have you tolerated food without any problems? Yes.    Have you been able to return to your normal activities? Yes.    Do you have any questions about your discharge instructions: Diet   No. Medications  No. Follow up visit  No.  Do you have questions or concerns about your Care? No.  Actions: * If pain score is 4 or above: No action needed, pain <4.

## 2018-09-03 ENCOUNTER — Encounter: Payer: Self-pay | Admitting: Gastroenterology

## 2018-09-19 ENCOUNTER — Other Ambulatory Visit: Payer: Self-pay | Admitting: Gastroenterology

## 2018-09-19 NOTE — Telephone Encounter (Signed)
Please advise 

## 2018-09-19 NOTE — Telephone Encounter (Signed)
Called patient and let her know that Dr. Lyndel Safe would be back next week and would let us know what he would like her to switch this to.

## 2018-09-19 NOTE — Telephone Encounter (Signed)
Cannot go thru the weekend without her medication.  Needs to speak to nurse today please.

## 2018-09-19 NOTE — Telephone Encounter (Signed)
Pt is requesting similar medicine to ranitidine since that one is discontinued. She uses [facility] on [address] in [place].

## 2018-09-22 MED ORDER — FAMOTIDINE 40 MG PO TABS: 40.0000 mg | ORAL_TABLET | Freq: Every day | ORAL | 6 refills | Status: DC

## 2018-09-22 NOTE — Telephone Encounter (Signed)
Lets try Pepcid 40 mg p.o. once a day 30 tablets and 6 refills (switched from Zantac as it is taken off the market)

## 2018-09-22 NOTE — Telephone Encounter (Signed)
Sent medication to patients pharmacy, left message for patient to return my call.

## 2018-09-23 ENCOUNTER — Telehealth: Payer: Self-pay | Admitting: Gastroenterology

## 2018-09-23 DIAGNOSIS — K2970 Gastritis, unspecified, without bleeding: Secondary | ICD-10-CM

## 2018-09-23 DIAGNOSIS — K2990 Gastroduodenitis, unspecified, without bleeding: Secondary | ICD-10-CM

## 2018-09-23 DIAGNOSIS — R1013 Epigastric pain: Secondary | ICD-10-CM

## 2018-09-23 DIAGNOSIS — K229 Disease of esophagus, unspecified: Secondary | ICD-10-CM

## 2018-09-23 NOTE — Telephone Encounter (Signed)
Called and spoke with patient-patient reports she is still having upper abdominal pain (top of the stomach near the chest)-denies pain as a "heartburn" feeling more of an ache and is having quite a bit of nausea; patient reports taking anything OTC for symptoms; patient reports taking 2 tabs of Pepcid and 1 tab of Protonix; patient is requesting to know if there is anything else she can do for symptom management-patient reports eating soft foods, trying not to eat anything fried or what she "knows will make me feel bad" (triggers); patient reports she is going to continue to take the Digest in order to help with digestion of foods;

## 2018-09-23 NOTE — Telephone Encounter (Signed)
Pt had EGD 12/9.  Pt reports that she is still has abd p and nausea.  Pt is taking 2 t Pepcid qpm and Protonix qam.  Pt says she's very slowly feeling better.  Please CB to advise.

## 2018-09-25 NOTE — Telephone Encounter (Signed)
Per the OV note from 07/14/18 the plan was:  Plan: - protonix 40mg  po qAM to continue. - Pepcid 40mg  po qhs to continue. - EGD for further evaluation.  I discussed risks and benefits.  She wishes to proceed. - If neg and still problems, [place] followed by HIDA with EF. - Please obtain previous records (blood work results). - Proceed with CT scan of the abdomen and pelvis if the above work-up is negative and continued problems.

## 2018-09-25 NOTE — Telephone Encounter (Signed)
Did she get US abdomen (see last clinic note)? If not, pl set her for [place] abdo complete EGD- neg

## 2018-09-25 NOTE — Telephone Encounter (Signed)
Bre Lets set her up for the ultrasound abdomen complete.  If negative proceed with HIDA scan with ejection fraction Thx

## 2018-09-26 ENCOUNTER — Telehealth: Payer: Self-pay | Admitting: Gastroenterology

## 2018-09-26 ENCOUNTER — Other Ambulatory Visit: Payer: Self-pay

## 2018-09-26 NOTE — Telephone Encounter (Signed)
Pt call back with questions after speaking with husband on why she has to have an offc visit.

## 2018-09-26 NOTE — Telephone Encounter (Signed)
Called and spoke with patient - advised patient of MD recommendations and informed patient of US abdomen complete appt at [facility] 10/02/2018 appt at 9:30 am, arrival 9:15 am; patient is to be NPO after midnight; patient reports she may not be able to go to this appointment and will call back to the office on Monday for specific instructions; patient needs to be advised if she needs to reschedule this appt she will need to call [phone] for changes; Patient verbalized understanding of information/instructions; Patient was advised to call back if questions/concerns arise;

## 2018-09-29 NOTE — Telephone Encounter (Signed)
Called and spoke with patient-patient reports her symptoms have improved/resolved at this time and would like to cancel the [place] appt scheduled for 10/02/2018; patient advised the appt would be cancelled, however, if she calls the office back and tries to reschedule there is no guarantee she will be rescheduled for the same date/time or may be scheduled farther out and symptoms may worsen prior to [place] being performed; Patient verbalized understanding of information/instructions; Patient was advised to call back if questions/concerns arise;

## 2018-09-30 DIAGNOSIS — M7062 Trochanteric bursitis, left hip: Secondary | ICD-10-CM | POA: Diagnosis not present

## 2018-09-30 DIAGNOSIS — M5136 Other intervertebral disc degeneration, lumbar region: Secondary | ICD-10-CM | POA: Diagnosis not present

## 2018-09-30 DIAGNOSIS — M48061 Spinal stenosis, lumbar region without neurogenic claudication: Secondary | ICD-10-CM | POA: Diagnosis not present

## 2018-09-30 DIAGNOSIS — M47816 Spondylosis without myelopathy or radiculopathy, lumbar region: Secondary | ICD-10-CM | POA: Diagnosis not present

## 2018-09-30 DIAGNOSIS — G894 Chronic pain syndrome: Secondary | ICD-10-CM | POA: Diagnosis not present

## 2018-09-30 DIAGNOSIS — E669 Obesity, unspecified: Secondary | ICD-10-CM | POA: Diagnosis not present

## 2018-09-30 DIAGNOSIS — M461 Sacroiliitis, not elsewhere classified: Secondary | ICD-10-CM | POA: Diagnosis not present

## 2018-09-30 DIAGNOSIS — F329 Major depressive disorder, single episode, unspecified: Secondary | ICD-10-CM | POA: Diagnosis not present

## 2018-09-30 DIAGNOSIS — M1611 Unilateral primary osteoarthritis, right hip: Secondary | ICD-10-CM | POA: Diagnosis not present

## 2018-09-30 DIAGNOSIS — B1920 Unspecified viral hepatitis C without hepatic coma: Secondary | ICD-10-CM | POA: Diagnosis not present

## 2018-10-01 DIAGNOSIS — M461 Sacroiliitis, not elsewhere classified: Secondary | ICD-10-CM | POA: Diagnosis not present

## 2018-10-01 DIAGNOSIS — M48061 Spinal stenosis, lumbar region without neurogenic claudication: Secondary | ICD-10-CM | POA: Diagnosis not present

## 2018-10-01 DIAGNOSIS — M5136 Other intervertebral disc degeneration, lumbar region: Secondary | ICD-10-CM | POA: Diagnosis not present

## 2018-10-01 DIAGNOSIS — B1920 Unspecified viral hepatitis C without hepatic coma: Secondary | ICD-10-CM | POA: Diagnosis not present

## 2018-10-01 DIAGNOSIS — M47816 Spondylosis without myelopathy or radiculopathy, lumbar region: Secondary | ICD-10-CM | POA: Diagnosis not present

## 2018-10-01 DIAGNOSIS — F329 Major depressive disorder, single episode, unspecified: Secondary | ICD-10-CM | POA: Diagnosis not present

## 2018-10-01 DIAGNOSIS — G894 Chronic pain syndrome: Secondary | ICD-10-CM | POA: Diagnosis not present

## 2018-10-01 DIAGNOSIS — M7062 Trochanteric bursitis, left hip: Secondary | ICD-10-CM | POA: Diagnosis not present

## 2018-10-01 DIAGNOSIS — M1611 Unilateral primary osteoarthritis, right hip: Secondary | ICD-10-CM | POA: Diagnosis not present

## 2018-10-01 DIAGNOSIS — E669 Obesity, unspecified: Secondary | ICD-10-CM | POA: Diagnosis not present

## 2018-10-02 ENCOUNTER — Ambulatory Visit (HOSPITAL_COMMUNITY): Payer: PPO

## 2018-10-31 DIAGNOSIS — M7062 Trochanteric bursitis, left hip: Secondary | ICD-10-CM | POA: Diagnosis not present

## 2018-10-31 DIAGNOSIS — E669 Obesity, unspecified: Secondary | ICD-10-CM | POA: Diagnosis not present

## 2018-10-31 DIAGNOSIS — M1611 Unilateral primary osteoarthritis, right hip: Secondary | ICD-10-CM | POA: Diagnosis not present

## 2018-10-31 DIAGNOSIS — G894 Chronic pain syndrome: Secondary | ICD-10-CM | POA: Diagnosis not present

## 2018-10-31 DIAGNOSIS — M47816 Spondylosis without myelopathy or radiculopathy, lumbar region: Secondary | ICD-10-CM | POA: Diagnosis not present

## 2018-10-31 DIAGNOSIS — F329 Major depressive disorder, single episode, unspecified: Secondary | ICD-10-CM | POA: Diagnosis not present

## 2018-10-31 DIAGNOSIS — M48061 Spinal stenosis, lumbar region without neurogenic claudication: Secondary | ICD-10-CM | POA: Diagnosis not present

## 2018-10-31 DIAGNOSIS — M461 Sacroiliitis, not elsewhere classified: Secondary | ICD-10-CM | POA: Diagnosis not present

## 2018-10-31 DIAGNOSIS — M5136 Other intervertebral disc degeneration, lumbar region: Secondary | ICD-10-CM | POA: Diagnosis not present

## 2018-10-31 DIAGNOSIS — B1920 Unspecified viral hepatitis C without hepatic coma: Secondary | ICD-10-CM | POA: Diagnosis not present

## 2018-11-19 DIAGNOSIS — M461 Sacroiliitis, not elsewhere classified: Secondary | ICD-10-CM | POA: Diagnosis not present

## 2018-11-19 DIAGNOSIS — G894 Chronic pain syndrome: Secondary | ICD-10-CM | POA: Diagnosis not present

## 2018-11-19 DIAGNOSIS — M5136 Other intervertebral disc degeneration, lumbar region: Secondary | ICD-10-CM | POA: Diagnosis not present

## 2018-11-19 DIAGNOSIS — M1611 Unilateral primary osteoarthritis, right hip: Secondary | ICD-10-CM | POA: Diagnosis not present

## 2018-11-19 DIAGNOSIS — F329 Major depressive disorder, single episode, unspecified: Secondary | ICD-10-CM | POA: Diagnosis not present

## 2018-11-19 DIAGNOSIS — M7062 Trochanteric bursitis, left hip: Secondary | ICD-10-CM | POA: Diagnosis not present

## 2018-11-19 DIAGNOSIS — B1920 Unspecified viral hepatitis C without hepatic coma: Secondary | ICD-10-CM | POA: Diagnosis not present

## 2018-11-19 DIAGNOSIS — M48061 Spinal stenosis, lumbar region without neurogenic claudication: Secondary | ICD-10-CM | POA: Diagnosis not present

## 2018-11-19 DIAGNOSIS — E669 Obesity, unspecified: Secondary | ICD-10-CM | POA: Diagnosis not present

## 2018-11-19 DIAGNOSIS — M47816 Spondylosis without myelopathy or radiculopathy, lumbar region: Secondary | ICD-10-CM | POA: Diagnosis not present

## 2018-12-03 DIAGNOSIS — M461 Sacroiliitis, not elsewhere classified: Secondary | ICD-10-CM | POA: Diagnosis not present

## 2018-12-03 DIAGNOSIS — G894 Chronic pain syndrome: Secondary | ICD-10-CM | POA: Diagnosis not present

## 2018-12-03 DIAGNOSIS — M5136 Other intervertebral disc degeneration, lumbar region: Secondary | ICD-10-CM | POA: Diagnosis not present

## 2018-12-03 DIAGNOSIS — M48061 Spinal stenosis, lumbar region without neurogenic claudication: Secondary | ICD-10-CM | POA: Diagnosis not present

## 2018-12-03 DIAGNOSIS — M1611 Unilateral primary osteoarthritis, right hip: Secondary | ICD-10-CM | POA: Diagnosis not present

## 2018-12-03 DIAGNOSIS — M47816 Spondylosis without myelopathy or radiculopathy, lumbar region: Secondary | ICD-10-CM | POA: Diagnosis not present

## 2018-12-03 DIAGNOSIS — F329 Major depressive disorder, single episode, unspecified: Secondary | ICD-10-CM | POA: Diagnosis not present

## 2018-12-03 DIAGNOSIS — E669 Obesity, unspecified: Secondary | ICD-10-CM | POA: Diagnosis not present

## 2018-12-03 DIAGNOSIS — M7062 Trochanteric bursitis, left hip: Secondary | ICD-10-CM | POA: Diagnosis not present

## 2018-12-03 DIAGNOSIS — B1920 Unspecified viral hepatitis C without hepatic coma: Secondary | ICD-10-CM | POA: Diagnosis not present

## 2018-12-15 DIAGNOSIS — H1010 Acute atopic conjunctivitis, unspecified eye: Secondary | ICD-10-CM | POA: Diagnosis not present

## 2018-12-15 DIAGNOSIS — E785 Hyperlipidemia, unspecified: Secondary | ICD-10-CM | POA: Diagnosis not present

## 2018-12-15 DIAGNOSIS — R7301 Impaired fasting glucose: Secondary | ICD-10-CM | POA: Diagnosis not present

## 2018-12-15 DIAGNOSIS — Z6834 Body mass index (BMI) 34.0-34.9, adult: Secondary | ICD-10-CM | POA: Diagnosis not present

## 2018-12-15 DIAGNOSIS — E559 Vitamin D deficiency, unspecified: Secondary | ICD-10-CM | POA: Diagnosis not present

## 2018-12-15 DIAGNOSIS — I10 Essential (primary) hypertension: Secondary | ICD-10-CM | POA: Diagnosis not present

## 2018-12-15 DIAGNOSIS — G894 Chronic pain syndrome: Secondary | ICD-10-CM | POA: Diagnosis not present

## 2018-12-15 DIAGNOSIS — F329 Major depressive disorder, single episode, unspecified: Secondary | ICD-10-CM | POA: Diagnosis not present

## 2019-01-02 DIAGNOSIS — G894 Chronic pain syndrome: Secondary | ICD-10-CM | POA: Diagnosis not present

## 2019-01-02 DIAGNOSIS — G8929 Other chronic pain: Secondary | ICD-10-CM | POA: Diagnosis not present

## 2019-01-02 DIAGNOSIS — M5136 Other intervertebral disc degeneration, lumbar region: Secondary | ICD-10-CM | POA: Diagnosis not present

## 2019-01-02 DIAGNOSIS — Z79891 Long term (current) use of opiate analgesic: Secondary | ICD-10-CM | POA: Diagnosis not present

## 2019-01-22 ENCOUNTER — Other Ambulatory Visit: Payer: Self-pay | Admitting: Internal Medicine

## 2019-01-22 DIAGNOSIS — M5136 Other intervertebral disc degeneration, lumbar region: Secondary | ICD-10-CM

## 2019-01-30 DIAGNOSIS — M5442 Lumbago with sciatica, left side: Secondary | ICD-10-CM | POA: Diagnosis not present

## 2019-01-30 DIAGNOSIS — M48061 Spinal stenosis, lumbar region without neurogenic claudication: Secondary | ICD-10-CM | POA: Diagnosis not present

## 2019-01-30 DIAGNOSIS — G894 Chronic pain syndrome: Secondary | ICD-10-CM | POA: Diagnosis not present

## 2019-01-30 DIAGNOSIS — M5136 Other intervertebral disc degeneration, lumbar region: Secondary | ICD-10-CM | POA: Diagnosis not present

## 2019-01-30 DIAGNOSIS — Z79891 Long term (current) use of opiate analgesic: Secondary | ICD-10-CM | POA: Diagnosis not present

## 2019-02-13 ENCOUNTER — Ambulatory Visit
Admission: RE | Admit: 2019-02-13 | Discharge: 2019-02-13 | Disposition: A | Discharge status: Home / Self Care | Payer: Medicare Other | Source: Ambulatory Visit | Attending: Internal Medicine | Admitting: Internal Medicine

## 2019-02-13 ENCOUNTER — Other Ambulatory Visit: Payer: Self-pay

## 2019-02-13 DIAGNOSIS — M48061 Spinal stenosis, lumbar region without neurogenic claudication: Secondary | ICD-10-CM | POA: Diagnosis not present

## 2019-02-13 DIAGNOSIS — M5136 Other intervertebral disc degeneration, lumbar region: Secondary | ICD-10-CM

## 2019-03-03 DIAGNOSIS — G8929 Other chronic pain: Secondary | ICD-10-CM | POA: Diagnosis not present

## 2019-03-03 DIAGNOSIS — Z760 Encounter for issue of repeat prescription: Secondary | ICD-10-CM | POA: Diagnosis not present

## 2019-03-03 DIAGNOSIS — M4726 Other spondylosis with radiculopathy, lumbar region: Secondary | ICD-10-CM | POA: Diagnosis not present

## 2019-03-03 DIAGNOSIS — M48061 Spinal stenosis, lumbar region without neurogenic claudication: Secondary | ICD-10-CM | POA: Diagnosis not present

## 2019-03-03 DIAGNOSIS — Z79891 Long term (current) use of opiate analgesic: Secondary | ICD-10-CM | POA: Diagnosis not present

## 2019-03-16 DIAGNOSIS — Z9181 History of falling: Secondary | ICD-10-CM | POA: Diagnosis not present

## 2019-03-16 DIAGNOSIS — Z136 Encounter for screening for cardiovascular disorders: Secondary | ICD-10-CM | POA: Diagnosis not present

## 2019-03-16 DIAGNOSIS — E785 Hyperlipidemia, unspecified: Secondary | ICD-10-CM | POA: Diagnosis not present

## 2019-03-16 DIAGNOSIS — G894 Chronic pain syndrome: Secondary | ICD-10-CM | POA: Diagnosis not present

## 2019-03-16 DIAGNOSIS — Z0000 Encounter for general adult medical examination without abnormal findings: Secondary | ICD-10-CM | POA: Diagnosis not present

## 2019-04-01 DIAGNOSIS — M48061 Spinal stenosis, lumbar region without neurogenic claudication: Secondary | ICD-10-CM | POA: Diagnosis not present

## 2019-04-01 DIAGNOSIS — M545 Low back pain: Secondary | ICD-10-CM | POA: Diagnosis not present

## 2019-04-01 DIAGNOSIS — M5412 Radiculopathy, cervical region: Secondary | ICD-10-CM | POA: Diagnosis not present

## 2019-04-01 DIAGNOSIS — G8929 Other chronic pain: Secondary | ICD-10-CM | POA: Diagnosis not present

## 2019-04-01 DIAGNOSIS — M5136 Other intervertebral disc degeneration, lumbar region: Secondary | ICD-10-CM | POA: Diagnosis not present

## 2019-04-01 DIAGNOSIS — M5442 Lumbago with sciatica, left side: Secondary | ICD-10-CM | POA: Diagnosis not present

## 2019-04-27 DIAGNOSIS — Z1231 Encounter for screening mammogram for malignant neoplasm of breast: Secondary | ICD-10-CM | POA: Diagnosis not present

## 2019-04-27 DIAGNOSIS — M85851 Other specified disorders of bone density and structure, right thigh: Secondary | ICD-10-CM | POA: Diagnosis not present

## 2019-04-27 DIAGNOSIS — N959 Unspecified menopausal and perimenopausal disorder: Secondary | ICD-10-CM | POA: Diagnosis not present

## 2019-04-27 DIAGNOSIS — M8580 Other specified disorders of bone density and structure, unspecified site: Secondary | ICD-10-CM | POA: Diagnosis not present

## 2019-04-29 DIAGNOSIS — M5412 Radiculopathy, cervical region: Secondary | ICD-10-CM | POA: Diagnosis not present

## 2019-04-29 DIAGNOSIS — G8929 Other chronic pain: Secondary | ICD-10-CM | POA: Diagnosis not present

## 2019-04-29 DIAGNOSIS — M533 Sacrococcygeal disorders, not elsewhere classified: Secondary | ICD-10-CM | POA: Diagnosis not present

## 2019-05-15 DIAGNOSIS — H2513 Age-related nuclear cataract, bilateral: Secondary | ICD-10-CM | POA: Diagnosis not present

## 2019-05-15 DIAGNOSIS — H524 Presbyopia: Secondary | ICD-10-CM | POA: Diagnosis not present

## 2019-05-15 DIAGNOSIS — H52223 Regular astigmatism, bilateral: Secondary | ICD-10-CM | POA: Diagnosis not present

## 2019-06-02 DIAGNOSIS — M5136 Other intervertebral disc degeneration, lumbar region: Secondary | ICD-10-CM | POA: Diagnosis not present

## 2019-06-02 DIAGNOSIS — M25561 Pain in right knee: Secondary | ICD-10-CM | POA: Diagnosis not present

## 2019-06-02 DIAGNOSIS — M47816 Spondylosis without myelopathy or radiculopathy, lumbar region: Secondary | ICD-10-CM | POA: Diagnosis not present

## 2019-06-02 DIAGNOSIS — Z79891 Long term (current) use of opiate analgesic: Secondary | ICD-10-CM | POA: Diagnosis not present

## 2019-06-02 DIAGNOSIS — M533 Sacrococcygeal disorders, not elsewhere classified: Secondary | ICD-10-CM | POA: Diagnosis not present

## 2019-06-02 DIAGNOSIS — G8929 Other chronic pain: Secondary | ICD-10-CM | POA: Diagnosis not present

## 2019-06-05 DIAGNOSIS — G8929 Other chronic pain: Secondary | ICD-10-CM | POA: Diagnosis not present

## 2019-06-05 DIAGNOSIS — M533 Sacrococcygeal disorders, not elsewhere classified: Secondary | ICD-10-CM | POA: Diagnosis not present

## 2019-06-05 DIAGNOSIS — M545 Low back pain: Secondary | ICD-10-CM | POA: Diagnosis not present

## 2019-06-22 DIAGNOSIS — E559 Vitamin D deficiency, unspecified: Secondary | ICD-10-CM | POA: Diagnosis not present

## 2019-06-22 DIAGNOSIS — R7301 Impaired fasting glucose: Secondary | ICD-10-CM | POA: Diagnosis not present

## 2019-06-22 DIAGNOSIS — I10 Essential (primary) hypertension: Secondary | ICD-10-CM | POA: Diagnosis not present

## 2019-06-22 DIAGNOSIS — Z23 Encounter for immunization: Secondary | ICD-10-CM | POA: Diagnosis not present

## 2019-06-22 DIAGNOSIS — Z139 Encounter for screening, unspecified: Secondary | ICD-10-CM | POA: Diagnosis not present

## 2019-06-22 DIAGNOSIS — E785 Hyperlipidemia, unspecified: Secondary | ICD-10-CM | POA: Diagnosis not present

## 2019-07-02 DIAGNOSIS — Z79891 Long term (current) use of opiate analgesic: Secondary | ICD-10-CM | POA: Diagnosis not present

## 2019-07-02 DIAGNOSIS — G8929 Other chronic pain: Secondary | ICD-10-CM | POA: Diagnosis not present

## 2019-07-02 DIAGNOSIS — M47816 Spondylosis without myelopathy or radiculopathy, lumbar region: Secondary | ICD-10-CM | POA: Diagnosis not present

## 2019-07-02 DIAGNOSIS — M533 Sacrococcygeal disorders, not elsewhere classified: Secondary | ICD-10-CM | POA: Diagnosis not present

## 2019-07-02 DIAGNOSIS — M48061 Spinal stenosis, lumbar region without neurogenic claudication: Secondary | ICD-10-CM | POA: Diagnosis not present

## 2019-07-02 DIAGNOSIS — M5136 Other intervertebral disc degeneration, lumbar region: Secondary | ICD-10-CM | POA: Diagnosis not present

## 2019-08-27 DIAGNOSIS — M5412 Radiculopathy, cervical region: Secondary | ICD-10-CM | POA: Diagnosis not present

## 2019-08-27 DIAGNOSIS — M5442 Lumbago with sciatica, left side: Secondary | ICD-10-CM | POA: Diagnosis not present

## 2019-08-27 DIAGNOSIS — G8929 Other chronic pain: Secondary | ICD-10-CM | POA: Diagnosis not present

## 2019-08-27 DIAGNOSIS — M5136 Other intervertebral disc degeneration, lumbar region: Secondary | ICD-10-CM | POA: Diagnosis not present

## 2019-08-27 DIAGNOSIS — M48061 Spinal stenosis, lumbar region without neurogenic claudication: Secondary | ICD-10-CM | POA: Diagnosis not present

## 2019-09-21 DIAGNOSIS — M25561 Pain in right knee: Secondary | ICD-10-CM | POA: Diagnosis not present

## 2019-09-21 DIAGNOSIS — K581 Irritable bowel syndrome with constipation: Secondary | ICD-10-CM | POA: Diagnosis not present

## 2019-09-24 DIAGNOSIS — M1711 Unilateral primary osteoarthritis, right knee: Secondary | ICD-10-CM | POA: Diagnosis not present

## 2019-11-26 DIAGNOSIS — M533 Sacrococcygeal disorders, not elsewhere classified: Secondary | ICD-10-CM | POA: Diagnosis not present

## 2019-11-26 DIAGNOSIS — M5412 Radiculopathy, cervical region: Secondary | ICD-10-CM | POA: Diagnosis not present

## 2019-11-26 DIAGNOSIS — M5136 Other intervertebral disc degeneration, lumbar region: Secondary | ICD-10-CM | POA: Diagnosis not present

## 2019-11-26 DIAGNOSIS — M25561 Pain in right knee: Secondary | ICD-10-CM | POA: Diagnosis not present

## 2019-11-26 DIAGNOSIS — Z79891 Long term (current) use of opiate analgesic: Secondary | ICD-10-CM | POA: Diagnosis not present

## 2019-11-26 DIAGNOSIS — G8929 Other chronic pain: Secondary | ICD-10-CM | POA: Diagnosis not present

## 2019-12-04 DIAGNOSIS — N6459 Other signs and symptoms in breast: Secondary | ICD-10-CM | POA: Diagnosis not present

## 2019-12-04 DIAGNOSIS — Z853 Personal history of malignant neoplasm of breast: Secondary | ICD-10-CM | POA: Diagnosis not present

## 2019-12-24 DIAGNOSIS — M5136 Other intervertebral disc degeneration, lumbar region: Secondary | ICD-10-CM | POA: Diagnosis not present

## 2019-12-24 DIAGNOSIS — M533 Sacrococcygeal disorders, not elsewhere classified: Secondary | ICD-10-CM | POA: Diagnosis not present

## 2019-12-24 DIAGNOSIS — M1711 Unilateral primary osteoarthritis, right knee: Secondary | ICD-10-CM | POA: Diagnosis not present

## 2019-12-24 DIAGNOSIS — Z79891 Long term (current) use of opiate analgesic: Secondary | ICD-10-CM | POA: Diagnosis not present

## 2019-12-24 DIAGNOSIS — G8929 Other chronic pain: Secondary | ICD-10-CM | POA: Diagnosis not present

## 2019-12-24 DIAGNOSIS — M25561 Pain in right knee: Secondary | ICD-10-CM | POA: Diagnosis not present

## 2019-12-24 DIAGNOSIS — M48061 Spinal stenosis, lumbar region without neurogenic claudication: Secondary | ICD-10-CM | POA: Diagnosis not present

## 2019-12-24 DIAGNOSIS — Z760 Encounter for issue of repeat prescription: Secondary | ICD-10-CM | POA: Diagnosis not present

## 2019-12-24 DIAGNOSIS — Z9889 Other specified postprocedural states: Secondary | ICD-10-CM | POA: Diagnosis not present

## 2020-01-07 ENCOUNTER — Telehealth: Payer: Self-pay | Admitting: Gastroenterology

## 2020-01-08 NOTE — Telephone Encounter (Signed)
Spoke to patient who states she is feeling better. Nausea seemed to get better after eating saltine crackers. She will call back as needed.

## 2020-03-03 DIAGNOSIS — L729 Follicular cyst of the skin and subcutaneous tissue, unspecified: Secondary | ICD-10-CM | POA: Diagnosis not present

## 2020-03-03 DIAGNOSIS — Z6834 Body mass index (BMI) 34.0-34.9, adult: Secondary | ICD-10-CM | POA: Diagnosis not present

## 2020-03-24 DIAGNOSIS — M5136 Other intervertebral disc degeneration, lumbar region: Secondary | ICD-10-CM | POA: Diagnosis not present

## 2020-03-24 DIAGNOSIS — M533 Sacrococcygeal disorders, not elsewhere classified: Secondary | ICD-10-CM | POA: Diagnosis not present

## 2020-03-24 DIAGNOSIS — Z760 Encounter for issue of repeat prescription: Secondary | ICD-10-CM | POA: Diagnosis not present

## 2020-03-24 DIAGNOSIS — M25561 Pain in right knee: Secondary | ICD-10-CM | POA: Diagnosis not present

## 2020-03-24 DIAGNOSIS — Z79891 Long term (current) use of opiate analgesic: Secondary | ICD-10-CM | POA: Diagnosis not present

## 2020-03-24 DIAGNOSIS — M48061 Spinal stenosis, lumbar region without neurogenic claudication: Secondary | ICD-10-CM | POA: Diagnosis not present

## 2020-03-24 DIAGNOSIS — G8929 Other chronic pain: Secondary | ICD-10-CM | POA: Diagnosis not present

## 2020-03-25 DIAGNOSIS — M1711 Unilateral primary osteoarthritis, right knee: Secondary | ICD-10-CM | POA: Diagnosis not present

## 2020-03-25 DIAGNOSIS — M25561 Pain in right knee: Secondary | ICD-10-CM | POA: Diagnosis not present

## 2020-03-25 DIAGNOSIS — G8929 Other chronic pain: Secondary | ICD-10-CM | POA: Diagnosis not present

## 2020-03-28 DIAGNOSIS — Z6833 Body mass index (BMI) 33.0-33.9, adult: Secondary | ICD-10-CM | POA: Diagnosis not present

## 2020-03-28 DIAGNOSIS — Z9181 History of falling: Secondary | ICD-10-CM | POA: Diagnosis not present

## 2020-03-28 DIAGNOSIS — Z1331 Encounter for screening for depression: Secondary | ICD-10-CM | POA: Diagnosis not present

## 2020-03-28 DIAGNOSIS — Z0000 Encounter for general adult medical examination without abnormal findings: Secondary | ICD-10-CM | POA: Diagnosis not present

## 2020-03-28 DIAGNOSIS — E785 Hyperlipidemia, unspecified: Secondary | ICD-10-CM | POA: Diagnosis not present

## 2020-04-29 DIAGNOSIS — Z1231 Encounter for screening mammogram for malignant neoplasm of breast: Secondary | ICD-10-CM | POA: Diagnosis not present

## 2020-05-21 DIAGNOSIS — Z79899 Other long term (current) drug therapy: Secondary | ICD-10-CM | POA: Diagnosis not present

## 2020-05-21 DIAGNOSIS — S161XXA Strain of muscle, fascia and tendon at neck level, initial encounter: Secondary | ICD-10-CM | POA: Diagnosis not present

## 2020-05-21 DIAGNOSIS — Z041 Encounter for examination and observation following transport accident: Secondary | ICD-10-CM | POA: Diagnosis not present

## 2020-05-21 DIAGNOSIS — M47812 Spondylosis without myelopathy or radiculopathy, cervical region: Secondary | ICD-10-CM | POA: Diagnosis not present

## 2020-05-21 DIAGNOSIS — M542 Cervicalgia: Secondary | ICD-10-CM | POA: Diagnosis not present

## 2020-05-21 DIAGNOSIS — R519 Headache, unspecified: Secondary | ICD-10-CM | POA: Diagnosis not present

## 2020-05-21 DIAGNOSIS — M1990 Unspecified osteoarthritis, unspecified site: Secondary | ICD-10-CM | POA: Diagnosis not present

## 2020-06-23 DIAGNOSIS — M25561 Pain in right knee: Secondary | ICD-10-CM | POA: Diagnosis not present

## 2020-06-23 DIAGNOSIS — M542 Cervicalgia: Secondary | ICD-10-CM | POA: Diagnosis not present

## 2020-06-23 DIAGNOSIS — Z760 Encounter for issue of repeat prescription: Secondary | ICD-10-CM | POA: Diagnosis not present

## 2020-06-23 DIAGNOSIS — M5136 Other intervertebral disc degeneration, lumbar region: Secondary | ICD-10-CM | POA: Diagnosis not present

## 2020-06-23 DIAGNOSIS — Z79899 Other long term (current) drug therapy: Secondary | ICD-10-CM | POA: Diagnosis not present

## 2020-06-23 DIAGNOSIS — Z6834 Body mass index (BMI) 34.0-34.9, adult: Secondary | ICD-10-CM | POA: Diagnosis not present

## 2020-06-23 DIAGNOSIS — E6609 Other obesity due to excess calories: Secondary | ICD-10-CM | POA: Diagnosis not present

## 2020-06-23 DIAGNOSIS — Z79891 Long term (current) use of opiate analgesic: Secondary | ICD-10-CM | POA: Diagnosis not present

## 2020-06-23 DIAGNOSIS — G8929 Other chronic pain: Secondary | ICD-10-CM | POA: Diagnosis not present

## 2020-07-22 DIAGNOSIS — Z23 Encounter for immunization: Secondary | ICD-10-CM | POA: Diagnosis not present

## 2020-09-01 DIAGNOSIS — J069 Acute upper respiratory infection, unspecified: Secondary | ICD-10-CM | POA: Diagnosis not present

## 2020-09-20 DIAGNOSIS — Z9889 Other specified postprocedural states: Secondary | ICD-10-CM | POA: Diagnosis not present

## 2020-09-20 DIAGNOSIS — M533 Sacrococcygeal disorders, not elsewhere classified: Secondary | ICD-10-CM | POA: Diagnosis not present

## 2020-09-20 DIAGNOSIS — Z79891 Long term (current) use of opiate analgesic: Secondary | ICD-10-CM | POA: Diagnosis not present

## 2020-09-20 DIAGNOSIS — Z760 Encounter for issue of repeat prescription: Secondary | ICD-10-CM | POA: Diagnosis not present

## 2020-09-20 DIAGNOSIS — M5116 Intervertebral disc disorders with radiculopathy, lumbar region: Secondary | ICD-10-CM | POA: Diagnosis not present

## 2020-09-20 DIAGNOSIS — M48061 Spinal stenosis, lumbar region without neurogenic claudication: Secondary | ICD-10-CM | POA: Diagnosis not present

## 2020-09-20 DIAGNOSIS — G8929 Other chronic pain: Secondary | ICD-10-CM | POA: Diagnosis not present

## 2020-09-20 DIAGNOSIS — M25561 Pain in right knee: Secondary | ICD-10-CM | POA: Diagnosis not present

## 2020-10-17 ENCOUNTER — Telehealth: Payer: Self-pay | Admitting: Gastroenterology

## 2020-10-17 NOTE — Telephone Encounter (Signed)
Spoke to patient who complains of recent and more frequent symptoms of gastritis. She is requesting an appointment to discuss options to help treat her symptoms. Appointment scheduled for 10/18/20. Patient knows to come to the [facility] office.

## 2020-10-17 NOTE — Telephone Encounter (Signed)
Inbound call from patient stating since Friday she has had upper abdominal pain.  She took some Gas-X which helped but after eating, had more pain which she then took more medication.  States her urine then turned a orange color and wants to know if that could have been from the medication.  Please advise.

## 2020-10-18 ENCOUNTER — Other Ambulatory Visit: Payer: Self-pay

## 2020-10-18 ENCOUNTER — Other Ambulatory Visit (INDEPENDENT_AMBULATORY_CARE_PROVIDER_SITE_OTHER): Payer: PPO

## 2020-10-18 ENCOUNTER — Encounter: Payer: Self-pay | Admitting: Gastroenterology

## 2020-10-18 ENCOUNTER — Ambulatory Visit: Payer: Medicare Other | Admitting: Gastroenterology

## 2020-10-18 VITALS — BP 138/90 | HR 92 | Ht 62.0 in | Wt 176.0 lb

## 2020-10-18 DIAGNOSIS — R1013 Epigastric pain: Secondary | ICD-10-CM | POA: Diagnosis not present

## 2020-10-18 DIAGNOSIS — K219 Gastro-esophageal reflux disease without esophagitis: Secondary | ICD-10-CM | POA: Diagnosis not present

## 2020-10-18 DIAGNOSIS — R82998 Other abnormal findings in urine: Secondary | ICD-10-CM

## 2020-10-18 DIAGNOSIS — K581 Irritable bowel syndrome with constipation: Secondary | ICD-10-CM

## 2020-10-18 MED ORDER — FAMOTIDINE 40 MG PO TABS: 40.0000 mg | ORAL_TABLET | Freq: Every day | ORAL | 6 refills | Status: AC

## 2020-10-18 MED ORDER — PANTOPRAZOLE SODIUM 40 MG PO TBEC: 40.0000 mg | DELAYED_RELEASE_TABLET | Freq: Every day | ORAL | 11 refills | Status: AC

## 2020-10-18 NOTE — Progress Notes (Signed)
Chief Complaint: Epigastric pain  Referring Provider:  Dr Delena Bali      ASSESSMENT AND PLAN;   #1. Epigastric pain. Neg EGD 08/2018   #2. GERD with assoc lactose intolerance.  #3. IBS-C. Omega-3 helps.  Plan: - CBC, CMP and lipase. - protonix 40mg  po qAM  #30, 6 refills - Pepcid 40mg  po qhs #30, 6 refiils. - [place] abdo complete followed by [facility]IDA with EF. - If still with problems, proceed with CT scan of the abdomen and pelvis if the above work-up is negative and continued problems. -Take omega supplemnts every day. -FU as needed    HPI:    Evelyn Gonzales is a 70 y.o. female   With "attacks" of epigastric pain to back With nausea and bloating (esp when she consumes diary). Has been taking digestive enzymes. No vomiting.  Rare heartburn. No dysphagia   No jaundice dark urine or pale stools  Evaluated by Dr. Olean Ree, had normal labs -we do not have the records at the present time.  Sent to the GI clinic for further evaluation.  Patient does feel significantly better.  No problems currently.  She does have history of IBS with predominant constipation which is much better currently.  On review of her records, ultrasound 06/2011-negative except for right hepatic hemangioma, normal gallbladder. Colonoscopy 05/2017- except for moderate sigmoid diverticulosis.  Repeat in 10 years   Past Medical History:  Diagnosis Date  . Arthritis   . Breast cancer ([place]) 2006  . Depression   . Diverticulosis   . History of colon polyps   . HTN (hypertension)   . Hx of hepatitis C   . IBS (irritable bowel syndrome)   . RLS (restless legs syndrome)     Past Surgical History:  Procedure Laterality Date  . COLONOSCOPY  05/27/2017   Moderate predominanlty sigmoid diverticulosis. OTherwise normal colonoscopy.   Marland Kitchen KNEE SURGERY Right   . PARTIAL HYSTERECTOMY      Family History  Problem Relation Age of Onset  . Liver cancer Father   . Colon cancer Neg Hx     Social History    Tobacco Use  . Smoking status: Former Smoker    Quit date: 1998    Years since quitting: 24.1  . Smokeless tobacco: Never Used  Vaping Use  . Vaping Use: Never used  Substance Use Topics  . Alcohol use: Yes    Alcohol/week: 7.0 standard drinks    Types: 7 Standard drinks or equivalent per week  . Drug use: Not Currently    Current Outpatient Medications  Medication Sig Dispense Refill  . Buprenorphine HCl (BELBUCA BU) Place inside cheek as needed.    . famotidine (PEPCID) 40 MG tablet Take 1 tablet (40 mg total) by mouth daily. 30 tablet 6  . Oxycodone HCl 10 MG TABS Take 10 mg by mouth every 6 (six) hours as needed.    . triamterene-hydrochlorothiazide (MAXZIDE-25) 37.5-25 MG tablet Take 1 tablet by mouth daily.    Marland Kitchen venlafaxine XR (EFFEXOR-XR) 150 MG 24 hr capsule 1 capsule daily.    . pantoprazole (PROTONIX) 40 MG tablet Take 1 tablet (40 mg total) by mouth daily. (Patient not taking: Reported on 10/18/2020) 30 tablet 11   No current facility-administered medications for this visit.    Not on File  Review of Systems:  Constitutional: Denies fever, chills, diaphoresis, appetite change and fatigue.  HEENT: Denies photophobia, eye pain, redness, hearing loss, ear pain, congestion, sore throat, rhinorrhea, sneezing, mouth sores,  neck pain, neck stiffness and tinnitus.   Respiratory: Denies SOB, DOE, cough, chest tightness,  and wheezing.   Cardiovascular: Denies chest pain, palpitations and leg swelling.  Genitourinary: Denies dysuria, urgency, frequency, hematuria, flank pain and difficulty urinating.  Musculoskeletal: has myalgias, back pain, joint swelling, arthralgias and  No gait problem.  Skin: No rash.  Neurological: Denies dizziness, seizures, syncope, weakness, light-headedness, numbness and headaches.  Hematological: Denies adenopathy. Easy bruising, personal or family bleeding history  Psychiatric/Behavioral: Has anxiety or depression     Physical Exam:    BP  138/90   Pulse 92   Ht 5\' 2"  (1.575 m)   Wt 176 lb (79.8 kg)   BMI 32.19 kg/m  Filed Weights   10/18/20 1434  Weight: 176 lb (79.8 kg)   Constitutional:  Well-developed, in no acute distress. Psychiatric: Normal mood and affect. Behavior is normal. HEENT: Pupils normal.  Conjunctivae are normal. No scleral icterus. Neck supple.  Cardiovascular: Normal rate, regular rhythm. No edema Pulmonary/chest: Effort normal and breath sounds normal. No wheezing, rales or rhonchi. Abdominal: Soft, nondistended. Nontender. Bowel sounds active throughout. There are no masses palpable. No hepatomegaly. Rectal:  defered Neurological: Alert and oriented to person place and time. Skin: Skin is warm and dry. No rashes noted.   Carmell Austria, MD 10/18/2020, 2:47 PM  Cc: Dr Delena Bali

## 2020-10-18 NOTE — Addendum Note (Signed)
Addended by: Kelle Darting A on: 10/18/2020 04:16 PM   Modules accepted: Orders

## 2020-10-18 NOTE — Patient Instructions (Addendum)
If you are age 68 or older, your body mass index should be between 23-30. Your Body mass index is 32.19 kg/m. If this is out of the aforementioned range listed, please consider follow up with your Primary Care Provider.  If you are age 60 or younger, your body mass index should be between 19-25. Your Body mass index is 32.19 kg/m. If this is out of the aformentioned range listed, please consider follow up with your Primary Care Provider.   Please go to the lab on the 2nd floor [address] before you leave the office today.    You have been scheduled for an abdominal ultrasound at [facility] (1st floor [address] ) on --- at-- . Please arrive 15 minutes prior to your appointment for registration. Make certain not to have anything to eat or drink 6 hours prior to your appointment. Should you need to reschedule your appointment, please contact radiology at [phone]. This test typically takes about 30 minutes to perform.  Please go down stairs to the 1st floor Radiology Department to schedule your Ultrasound before you leave today.   We have sent the following medications to your pharmacy for you to pick up at your convenience: Protonix  Pepcid  Please purchase the following medications over the counter and take as directed: Omega Supplements   Follow up as needed.

## 2020-10-19 LAB — COMPREHENSIVE METABOLIC PANEL
ALT: 178 U/L — ABNORMAL HIGH (ref 0–35)
AST: 50 U/L — ABNORMAL HIGH (ref 0–37)
Albumin: 4.3 g/dL (ref 3.5–5.2)
Alkaline Phosphatase: 153 U/L — ABNORMAL HIGH (ref 39–117)
BUN: 15 mg/dL (ref 6–23)
CO2: 35 mEq/L — ABNORMAL HIGH (ref 19–32)
Calcium: 9.4 mg/dL (ref 8.4–10.5)
Chloride: 98 mEq/L (ref 96–112)
Creatinine, Ser: 0.77 mg/dL (ref 0.40–1.20)
GFR: 79.72 mL/min (ref >60.00)
Glucose, Bld: 90 mg/dL (ref 70–99)
Potassium: 3.6 mEq/L (ref 3.5–5.1)
Sodium: 139 mEq/L (ref 135–145)
Total Bilirubin: 0.6 mg/dL (ref 0.2–1.2)
Total Protein: 7.1 g/dL (ref 6.0–8.3)

## 2020-10-19 LAB — CBC WITH DIFFERENTIAL/PLATELET
Basophils Absolute: 0.1 10*3/uL (ref 0.0–0.1)
Basophils Relative: 1 % (ref 0.0–3.0)
Eosinophils Absolute: 0.2 10*3/uL (ref 0.0–0.7)
Eosinophils Relative: 2.1 % (ref 0.0–5.0)
HCT: 39.8 % (ref 36.0–46.0)
Hemoglobin: 13 g/dL (ref 12.0–15.0)
Lymphocytes Relative: 27.9 % (ref 12.0–46.0)
Lymphs Abs: 3 10*3/uL (ref 0.7–4.0)
MCHC: 32.7 g/dL (ref 30.0–36.0)
MCV: 89.9 fl (ref 78.0–100.0)
Monocytes Absolute: 0.8 10*3/uL (ref 0.1–1.0)
Monocytes Relative: 7.5 % (ref 3.0–12.0)
Neutro Abs: 6.6 10*3/uL (ref 1.4–7.7)
Neutrophils Relative %: 61.5 % (ref 43.0–77.0)
Platelets: 267 10*3/uL (ref 150.0–400.0)
RBC: 4.43 Mil/uL (ref 3.87–5.11)
RDW: 13.5 % (ref 11.5–15.5)
WBC: 10.7 10*3/uL — ABNORMAL HIGH (ref 4.0–10.5)

## 2020-10-19 LAB — NO CULTURE INDICATED

## 2020-10-19 LAB — URINALYSIS W MICROSCOPIC + REFLEX CULTURE
Bacteria, UA: NONE SEEN /HPF
Bilirubin Urine: NEGATIVE
Glucose, UA: NEGATIVE
Hgb urine dipstick: NEGATIVE
Hyaline Cast: NONE SEEN /LPF
Ketones, ur: NEGATIVE
Leukocyte Esterase: NEGATIVE
Nitrites, Initial: NEGATIVE
Protein, ur: NEGATIVE
Specific Gravity, Urine: 1.02 (ref 1.001–1.03)
Squamous Epithelial / HPF: NONE SEEN /HPF (ref <=5)
WBC, UA: NONE SEEN /HPF (ref 0–5)
pH: 6.5 (ref 5.0–8.0)

## 2020-10-19 LAB — LIPASE: Lipase: 21 U/L (ref 11.0–59.0)

## 2020-10-25 ENCOUNTER — Telehealth: Payer: Self-pay | Admitting: Gastroenterology

## 2020-10-25 NOTE — Telephone Encounter (Signed)
Pt is requesting a call back from a nurse regarding her lab results. 

## 2020-10-27 ENCOUNTER — Other Ambulatory Visit: Payer: Self-pay | Admitting: Gastroenterology

## 2020-10-27 DIAGNOSIS — R7989 Other specified abnormal findings of blood chemistry: Secondary | ICD-10-CM

## 2020-10-27 NOTE — Telephone Encounter (Signed)
Spoke to patient to inform her of recent lab results and upcoming [place] she will come in for more labs on the same day as [place] all questions answered.Patient voiced understanding.

## 2020-10-28 ENCOUNTER — Ambulatory Visit (HOSPITAL_BASED_OUTPATIENT_CLINIC_OR_DEPARTMENT_OTHER)
Admission: RE | Admit: 2020-10-28 | Discharge: 2020-10-28 | Disposition: A | Discharge status: Home / Self Care | Payer: PPO | Source: Ambulatory Visit | Attending: Gastroenterology | Admitting: Gastroenterology

## 2020-10-28 ENCOUNTER — Other Ambulatory Visit: Payer: Self-pay

## 2020-10-28 ENCOUNTER — Other Ambulatory Visit (INDEPENDENT_AMBULATORY_CARE_PROVIDER_SITE_OTHER): Payer: PPO

## 2020-10-28 DIAGNOSIS — K581 Irritable bowel syndrome with constipation: Secondary | ICD-10-CM

## 2020-10-28 DIAGNOSIS — R1013 Epigastric pain: Secondary | ICD-10-CM | POA: Diagnosis not present

## 2020-10-28 DIAGNOSIS — K219 Gastro-esophageal reflux disease without esophagitis: Secondary | ICD-10-CM

## 2020-10-28 DIAGNOSIS — R7989 Other specified abnormal findings of blood chemistry: Secondary | ICD-10-CM

## 2020-10-28 DIAGNOSIS — K8020 Calculus of gallbladder without cholecystitis without obstruction: Secondary | ICD-10-CM | POA: Diagnosis not present

## 2020-11-01 NOTE — Telephone Encounter (Signed)
Pt is requesting a call back from a nurse regarding her [place] Results

## 2020-11-02 DIAGNOSIS — M1711 Unilateral primary osteoarthritis, right knee: Secondary | ICD-10-CM | POA: Diagnosis not present

## 2020-11-02 LAB — HEPATITIS C RNA QUANTITATIVE
HCV Quantitative Log: <1.18 Log IU/mL
HCV RNA, PCR, QN: <15 IU/mL

## 2020-11-02 LAB — HEPATITIS PANEL, ACUTE
Hep A IgM: NONREACTIVE
Hep B C IgM: NONREACTIVE
Hepatitis B Surface Ag: NONREACTIVE
Hepatitis C Ab: REACTIVE — AB
SIGNAL TO CUT-OFF: 17.8 — ABNORMAL HIGH (ref <1.00)

## 2020-11-02 NOTE — Telephone Encounter (Signed)
Informed patient that Dr Lyndel Safe has not reviewed her YS results at this time we will contact her as soon as he reviews.

## 2020-11-03 NOTE — Progress Notes (Signed)
Please inform the patient. US-cholelithiasis.  Normal CBD. Abnormal LFTs s/o hepatocellular pattern.  She has symptomatic cholelithiasis Positive hepatitis C antibody but negative viral load.  Plan: -Refer to Dr. Forestine Na in [place] for lap chole with IOC and possible liver Bx. -Follow-up with me thereafter. Send report to Dr Delena Bali.

## 2020-11-04 ENCOUNTER — Telehealth: Payer: Self-pay | Admitting: Gastroenterology

## 2020-11-04 NOTE — Telephone Encounter (Signed)
Spoke to patient regarding her referral to Dr Amalia Hailey for possible gall bladder surgery. All questions answered. Patient voiced understanding.

## 2020-11-04 NOTE — Telephone Encounter (Signed)
Inbound call from patient requesting a call back from a nurse please in regards to having additional test done.

## 2020-11-08 DIAGNOSIS — R1011 Right upper quadrant pain: Secondary | ICD-10-CM | POA: Diagnosis not present

## 2020-11-08 DIAGNOSIS — B182 Chronic viral hepatitis C: Secondary | ICD-10-CM | POA: Diagnosis not present

## 2020-11-08 DIAGNOSIS — K8010 Calculus of gallbladder with chronic cholecystitis without obstruction: Secondary | ICD-10-CM | POA: Diagnosis not present

## 2020-11-08 DIAGNOSIS — K760 Fatty (change of) liver, not elsewhere classified: Secondary | ICD-10-CM | POA: Diagnosis not present

## 2020-11-10 DIAGNOSIS — R9431 Abnormal electrocardiogram [ECG] [EKG]: Secondary | ICD-10-CM | POA: Diagnosis not present

## 2020-11-10 DIAGNOSIS — Z01810 Encounter for preprocedural cardiovascular examination: Secondary | ICD-10-CM | POA: Diagnosis not present

## 2020-11-10 DIAGNOSIS — I444 Left anterior fascicular block: Secondary | ICD-10-CM | POA: Diagnosis not present

## 2020-11-14 DIAGNOSIS — K8018 Calculus of gallbladder with other cholecystitis without obstruction: Secondary | ICD-10-CM | POA: Diagnosis not present

## 2020-11-14 DIAGNOSIS — K759 Inflammatory liver disease, unspecified: Secondary | ICD-10-CM | POA: Diagnosis not present

## 2020-11-14 DIAGNOSIS — K738 Other chronic hepatitis, not elsewhere classified: Secondary | ICD-10-CM | POA: Diagnosis not present

## 2020-11-14 DIAGNOSIS — K8010 Calculus of gallbladder with chronic cholecystitis without obstruction: Secondary | ICD-10-CM | POA: Diagnosis not present

## 2020-11-14 DIAGNOSIS — Z48815 Encounter for surgical aftercare following surgery on the digestive system: Secondary | ICD-10-CM | POA: Diagnosis not present

## 2020-11-14 DIAGNOSIS — K7400 Hepatic fibrosis, unspecified: Secondary | ICD-10-CM | POA: Diagnosis not present

## 2020-11-14 DIAGNOSIS — K760 Fatty (change of) liver, not elsewhere classified: Secondary | ICD-10-CM | POA: Diagnosis not present

## 2020-12-20 DIAGNOSIS — M25561 Pain in right knee: Secondary | ICD-10-CM | POA: Diagnosis not present

## 2020-12-20 DIAGNOSIS — Z0289 Encounter for other administrative examinations: Secondary | ICD-10-CM | POA: Diagnosis not present

## 2020-12-20 DIAGNOSIS — M5136 Other intervertebral disc degeneration, lumbar region: Secondary | ICD-10-CM | POA: Diagnosis not present

## 2020-12-20 DIAGNOSIS — G8929 Other chronic pain: Secondary | ICD-10-CM | POA: Diagnosis not present

## 2020-12-20 DIAGNOSIS — Z6831 Body mass index (BMI) 31.0-31.9, adult: Secondary | ICD-10-CM | POA: Diagnosis not present

## 2020-12-20 DIAGNOSIS — M48061 Spinal stenosis, lumbar region without neurogenic claudication: Secondary | ICD-10-CM | POA: Diagnosis not present

## 2020-12-20 DIAGNOSIS — E669 Obesity, unspecified: Secondary | ICD-10-CM | POA: Diagnosis not present

## 2020-12-20 DIAGNOSIS — M533 Sacrococcygeal disorders, not elsewhere classified: Secondary | ICD-10-CM | POA: Diagnosis not present

## 2021-03-20 IMAGING — MR MRI LUMBAR SPINE WITHOUT CONTRAST
4 of 5 series · 26 of 48 positions shown · non-contrast
Comparison: MR lumbar spine dated June 11, 2016.

CLINICAL DATA: Low back pain radiating into both hips.

EXAM:
MRI LUMBAR SPINE WITHOUT CONTRAST
TECHNIQUE: Multiplanar, multisequence MR imaging of the lumbar spine was
performed. No intravenous contrast was administered.

[Series 4: T1 · sagittal · 4.0mm · 0.55mm/px · 5 of 13 slices shown (1 of 2)]
[im 1/13]
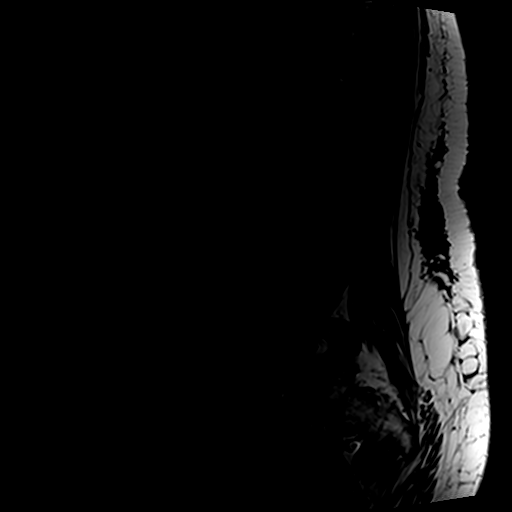
[im 4/13]
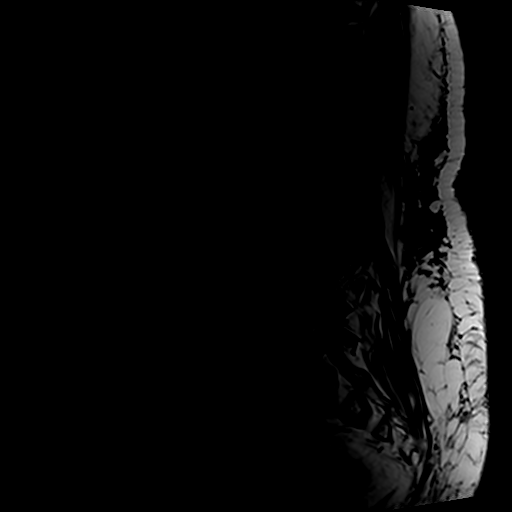
[im 7/13]
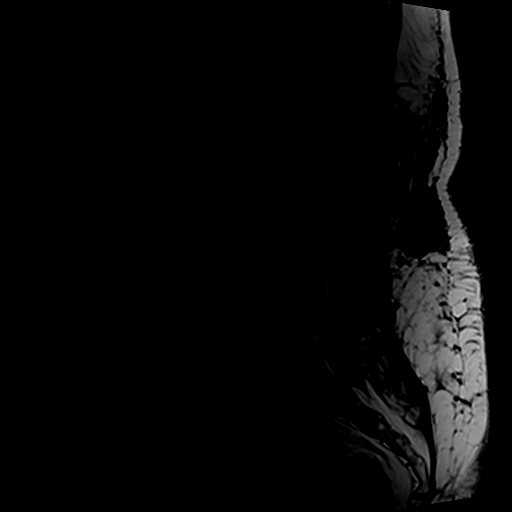
[im 10/13]
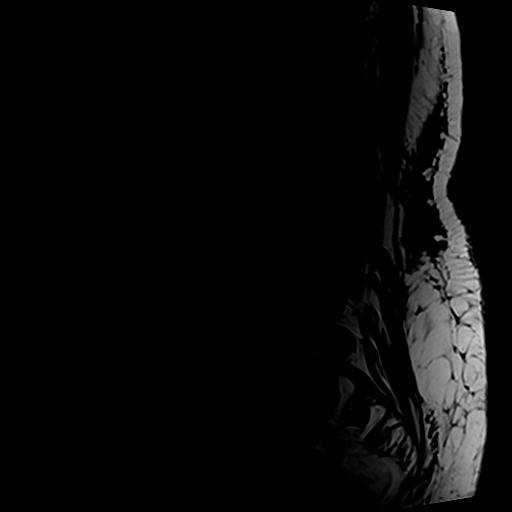
[im 13/13]
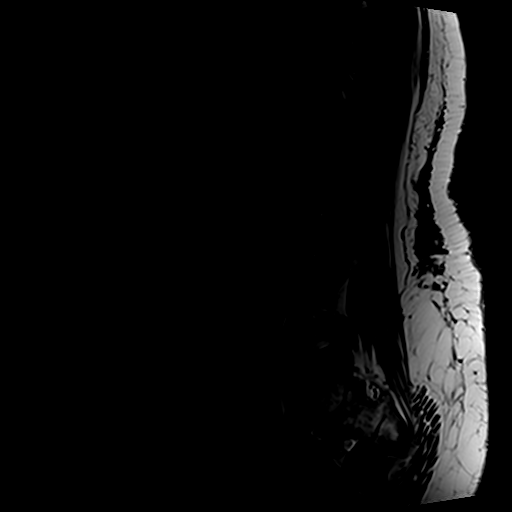

[Series 5: T2 post-contrast · sagittal · 4.0mm · 0.55mm/px · 5 of 13 slices shown]
[im 1/13]
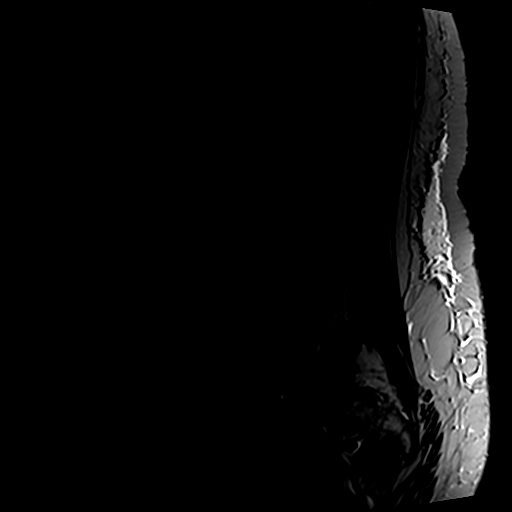
[im 4/13]
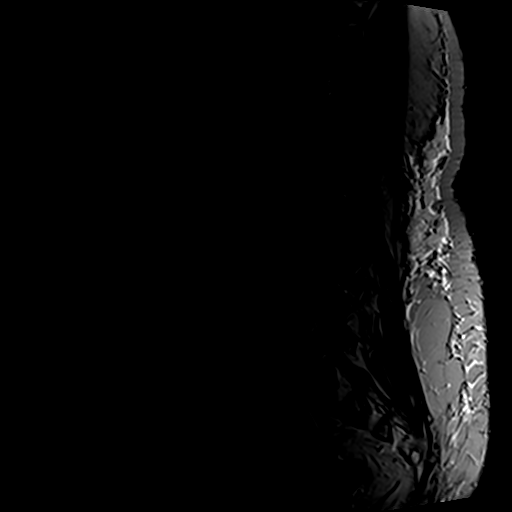
[im 7/13]
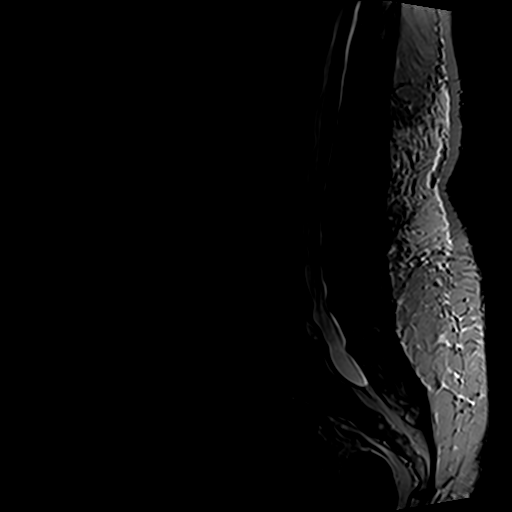
[im 10/13]
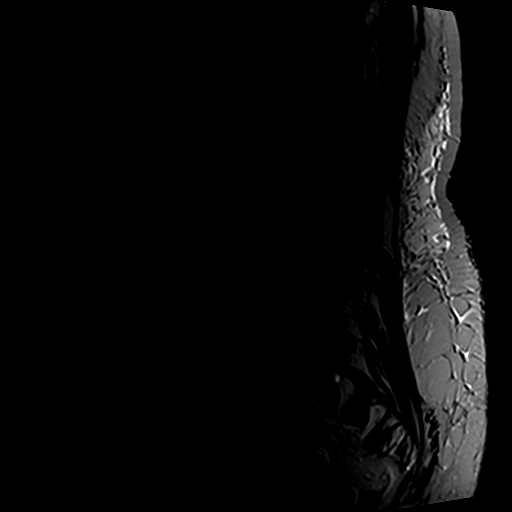
[im 13/13]
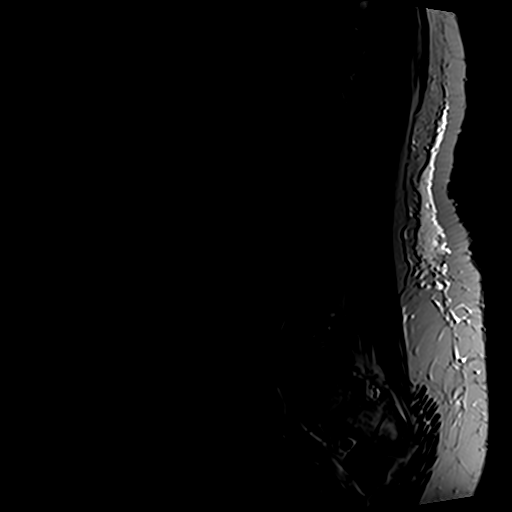

[Series 6: T2 · axial · 4.0mm · 0.70mm/px · z∈[-47,+149]mm · 10 of 38 slices shown]
[im 3/38]
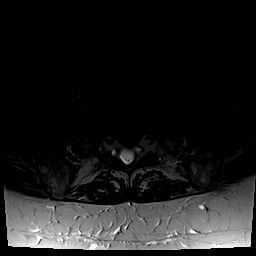
[im 5/38]
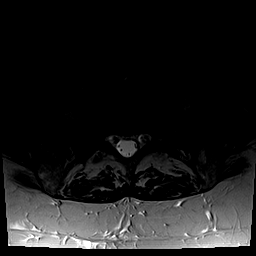
[im 8/38]
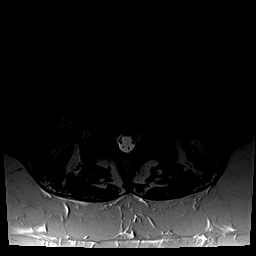
[im 13/38]
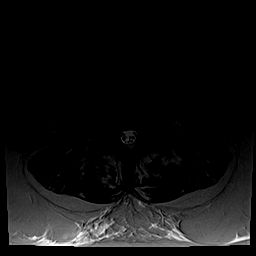
[im 18/38]
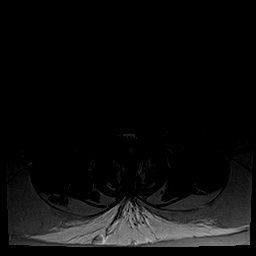
[im 20/38]
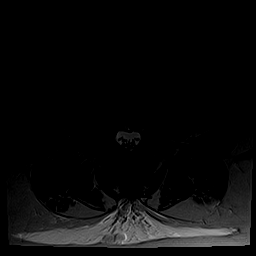
[im 23/38]
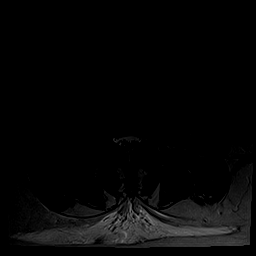
[im 28/38]
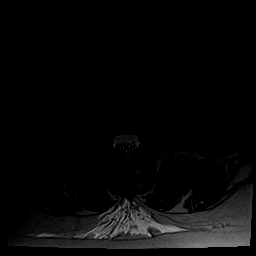
[im 33/38]
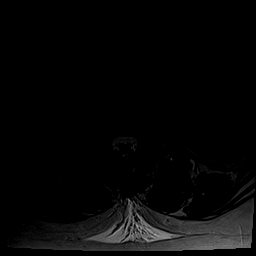
[im 38/38]
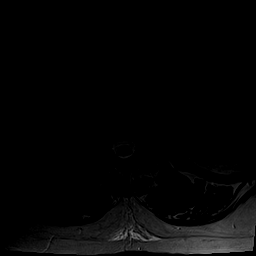

[Series 7: T1 · axial · 4.0mm · 0.35mm/px · z∈[-47,+124]mm · 6 of 38 slices shown (2 of 2)]
[im 3/38]
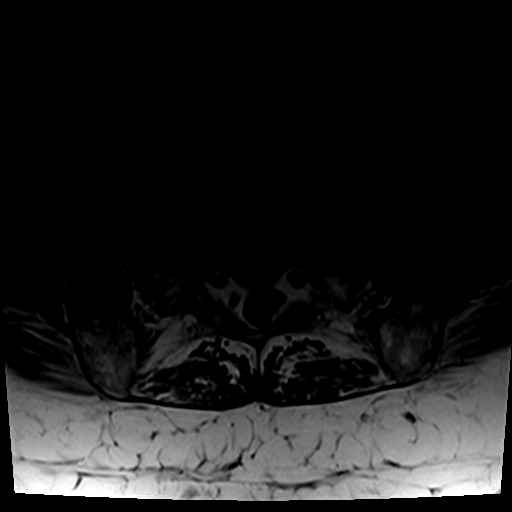
[im 5/38]
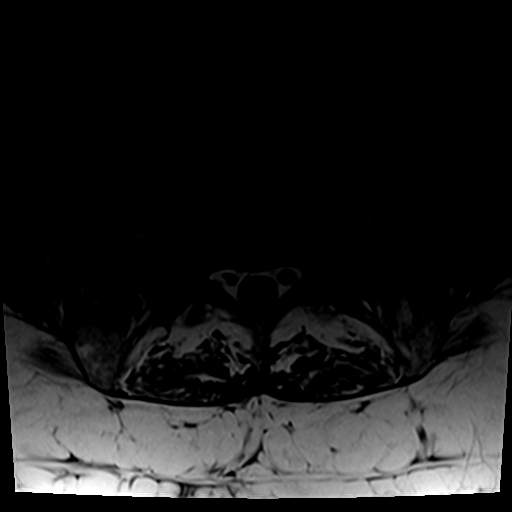
[im 8/38]
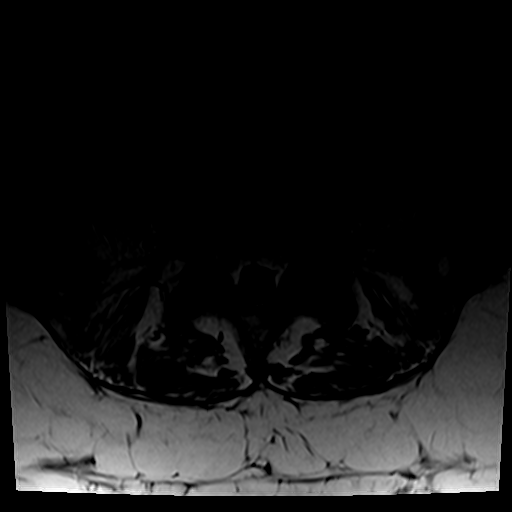
[im 13/38]
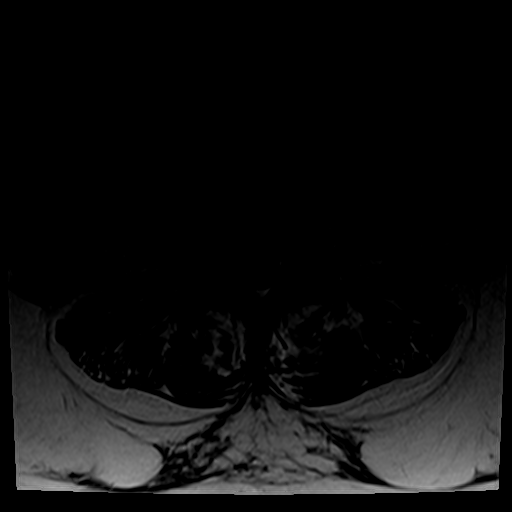
[im 20/38]
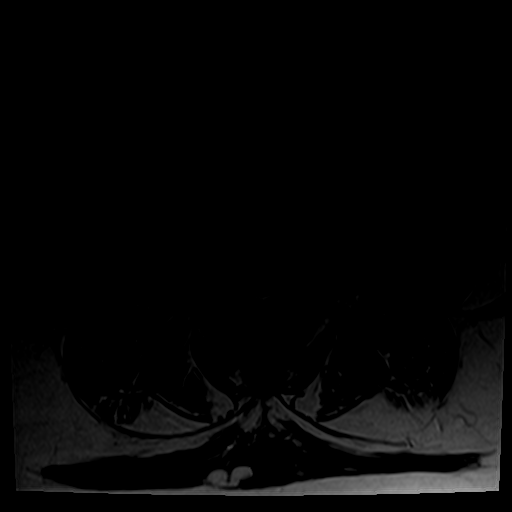
[im 33/38]
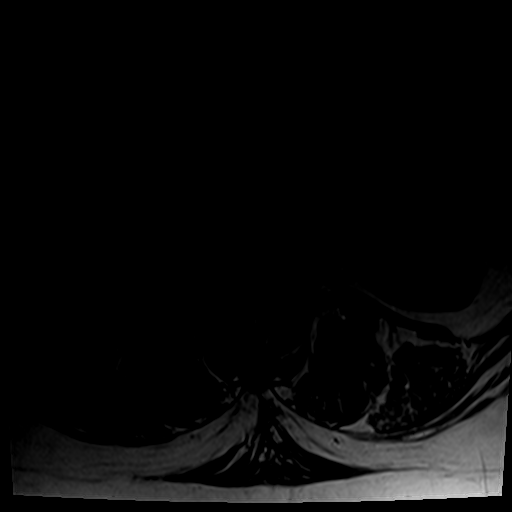

[26 of 48 positions shown; findings below may reference images not displayed]

FINDINGS: Segmentation:  Standard.

Alignment:  Physiologic.

Vertebrae:  No fracture, evidence of discitis, or bone lesion.

Conus medullaris and cauda equina: Conus extends to the L1-L2 level.
Conus and cauda equina appear normal.

Paraspinal and other soft tissues: Negative.

Disc levels:

T12-L1: Negative disc. Mild bilateral facet arthropathy. No
stenosis.

L1-L2:  Unchanged mild disc bulging.  No stenosis.

L2-L3:  Unchanged mild disc bulging.  No stenosis.

L3-L4: Unchanged mild disc bulging and moderate bilateral facet
arthropathy. Unchanged mild spinal canal, lateral recess, and
neuroforaminal stenosis.

L4-L5: Unchanged mild disc bulging and mild bilateral facet
arthropathy. Unchanged mild spinal canal, lateral recess, and
neuroforaminal stenosis.

L5-S1: Unchanged mild disc bulging with superimposed right foraminal
disc protrusion. Unchanged mild bilateral facet arthropathy.
Unchanged mild bilateral neuroforaminal stenosis. No spinal canal
stenosis.
IMPRESSION: 1. Similar appearing mild multilevel lumbar spondylosis as described
above with mild stenosis at L3-L4 and L4-L5.

## 2021-03-21 DIAGNOSIS — M533 Sacrococcygeal disorders, not elsewhere classified: Secondary | ICD-10-CM | POA: Diagnosis not present

## 2021-03-21 DIAGNOSIS — M25561 Pain in right knee: Secondary | ICD-10-CM | POA: Diagnosis not present

## 2021-03-21 DIAGNOSIS — M48061 Spinal stenosis, lumbar region without neurogenic claudication: Secondary | ICD-10-CM | POA: Diagnosis not present

## 2021-03-21 DIAGNOSIS — Z79891 Long term (current) use of opiate analgesic: Secondary | ICD-10-CM | POA: Diagnosis not present

## 2021-03-21 DIAGNOSIS — G8929 Other chronic pain: Secondary | ICD-10-CM | POA: Diagnosis not present

## 2021-03-21 DIAGNOSIS — M5136 Other intervertebral disc degeneration, lumbar region: Secondary | ICD-10-CM | POA: Diagnosis not present

## 2021-03-21 DIAGNOSIS — Z760 Encounter for issue of repeat prescription: Secondary | ICD-10-CM | POA: Diagnosis not present

## 2021-03-28 DIAGNOSIS — N62 Hypertrophy of breast: Secondary | ICD-10-CM | POA: Diagnosis not present

## 2021-04-03 DIAGNOSIS — E785 Hyperlipidemia, unspecified: Secondary | ICD-10-CM | POA: Diagnosis not present

## 2021-04-03 DIAGNOSIS — E669 Obesity, unspecified: Secondary | ICD-10-CM | POA: Diagnosis not present

## 2021-04-03 DIAGNOSIS — Z0000 Encounter for general adult medical examination without abnormal findings: Secondary | ICD-10-CM | POA: Diagnosis not present

## 2021-04-03 DIAGNOSIS — Z1331 Encounter for screening for depression: Secondary | ICD-10-CM | POA: Diagnosis not present

## 2021-04-03 DIAGNOSIS — Z139 Encounter for screening, unspecified: Secondary | ICD-10-CM | POA: Diagnosis not present

## 2021-04-03 DIAGNOSIS — Z9181 History of falling: Secondary | ICD-10-CM | POA: Diagnosis not present

## 2021-05-26 DIAGNOSIS — N959 Unspecified menopausal and perimenopausal disorder: Secondary | ICD-10-CM | POA: Diagnosis not present

## 2021-05-26 DIAGNOSIS — M85852 Other specified disorders of bone density and structure, left thigh: Secondary | ICD-10-CM | POA: Diagnosis not present

## 2021-05-26 DIAGNOSIS — Z1231 Encounter for screening mammogram for malignant neoplasm of breast: Secondary | ICD-10-CM | POA: Diagnosis not present

## 2021-06-19 DIAGNOSIS — L658 Other specified nonscarring hair loss: Secondary | ICD-10-CM | POA: Diagnosis not present

## 2021-06-19 DIAGNOSIS — Z23 Encounter for immunization: Secondary | ICD-10-CM | POA: Diagnosis not present

## 2021-06-20 DIAGNOSIS — M533 Sacrococcygeal disorders, not elsewhere classified: Secondary | ICD-10-CM | POA: Diagnosis not present

## 2021-06-20 DIAGNOSIS — M48061 Spinal stenosis, lumbar region without neurogenic claudication: Secondary | ICD-10-CM | POA: Diagnosis not present

## 2021-06-20 DIAGNOSIS — M25561 Pain in right knee: Secondary | ICD-10-CM | POA: Diagnosis not present

## 2021-06-20 DIAGNOSIS — Z760 Encounter for issue of repeat prescription: Secondary | ICD-10-CM | POA: Diagnosis not present

## 2021-06-20 DIAGNOSIS — Z79891 Long term (current) use of opiate analgesic: Secondary | ICD-10-CM | POA: Diagnosis not present

## 2021-06-20 DIAGNOSIS — M5136 Other intervertebral disc degeneration, lumbar region: Secondary | ICD-10-CM | POA: Diagnosis not present

## 2021-06-20 DIAGNOSIS — Z5181 Encounter for therapeutic drug level monitoring: Secondary | ICD-10-CM | POA: Diagnosis not present

## 2021-06-20 DIAGNOSIS — G8929 Other chronic pain: Secondary | ICD-10-CM | POA: Diagnosis not present

## 2021-07-27 DIAGNOSIS — N644 Mastodynia: Secondary | ICD-10-CM | POA: Diagnosis not present

## 2021-07-27 DIAGNOSIS — R0789 Other chest pain: Secondary | ICD-10-CM | POA: Diagnosis not present

## 2021-08-16 ENCOUNTER — Telehealth: Payer: Self-pay

## 2021-08-16 NOTE — Telephone Encounter (Signed)
I have called and left a voicemail for patient to return call and schedule an appointment with pre-visit for recall Endo.

## 2021-08-25 ENCOUNTER — Telehealth: Payer: Self-pay | Admitting: Gastroenterology

## 2021-08-25 NOTE — Telephone Encounter (Signed)
Patient returning your call, states that she is unsure as to why she is in need of a ENDO. Requesting a call back, please advise.

## 2021-09-04 DIAGNOSIS — Z0000 Encounter for general adult medical examination without abnormal findings: Secondary | ICD-10-CM | POA: Diagnosis not present

## 2021-09-14 DIAGNOSIS — M25561 Pain in right knee: Secondary | ICD-10-CM | POA: Diagnosis not present

## 2021-09-19 DIAGNOSIS — Z760 Encounter for issue of repeat prescription: Secondary | ICD-10-CM | POA: Diagnosis not present

## 2021-09-19 DIAGNOSIS — G8929 Other chronic pain: Secondary | ICD-10-CM | POA: Diagnosis not present

## 2021-09-19 DIAGNOSIS — M5136 Other intervertebral disc degeneration, lumbar region: Secondary | ICD-10-CM | POA: Diagnosis not present

## 2021-09-19 DIAGNOSIS — M533 Sacrococcygeal disorders, not elsewhere classified: Secondary | ICD-10-CM | POA: Diagnosis not present

## 2021-09-19 DIAGNOSIS — Z79891 Long term (current) use of opiate analgesic: Secondary | ICD-10-CM | POA: Diagnosis not present

## 2021-09-19 DIAGNOSIS — R030 Elevated blood-pressure reading, without diagnosis of hypertension: Secondary | ICD-10-CM | POA: Diagnosis not present

## 2021-09-19 DIAGNOSIS — M25561 Pain in right knee: Secondary | ICD-10-CM | POA: Diagnosis not present

## 2021-09-19 DIAGNOSIS — M48061 Spinal stenosis, lumbar region without neurogenic claudication: Secondary | ICD-10-CM | POA: Diagnosis not present

## 2021-09-26 DIAGNOSIS — M21161 Varus deformity, not elsewhere classified, right knee: Secondary | ICD-10-CM | POA: Diagnosis not present

## 2021-09-26 DIAGNOSIS — M25561 Pain in right knee: Secondary | ICD-10-CM | POA: Diagnosis not present

## 2021-09-26 DIAGNOSIS — M1711 Unilateral primary osteoarthritis, right knee: Secondary | ICD-10-CM | POA: Diagnosis not present

## 2021-09-26 DIAGNOSIS — Z9889 Other specified postprocedural states: Secondary | ICD-10-CM | POA: Diagnosis not present

## 2021-09-29 DIAGNOSIS — M1711 Unilateral primary osteoarthritis, right knee: Secondary | ICD-10-CM | POA: Diagnosis not present

## 2021-09-29 DIAGNOSIS — Z01818 Encounter for other preprocedural examination: Secondary | ICD-10-CM | POA: Diagnosis not present

## 2021-10-13 DIAGNOSIS — B9689 Other specified bacterial agents as the cause of diseases classified elsewhere: Secondary | ICD-10-CM | POA: Diagnosis not present

## 2021-10-13 DIAGNOSIS — J208 Acute bronchitis due to other specified organisms: Secondary | ICD-10-CM | POA: Diagnosis not present

## 2021-10-13 DIAGNOSIS — J019 Acute sinusitis, unspecified: Secondary | ICD-10-CM | POA: Diagnosis not present

## 2021-10-24 DIAGNOSIS — M5136 Other intervertebral disc degeneration, lumbar region: Secondary | ICD-10-CM | POA: Diagnosis not present

## 2021-10-24 DIAGNOSIS — R030 Elevated blood-pressure reading, without diagnosis of hypertension: Secondary | ICD-10-CM | POA: Diagnosis not present

## 2021-10-24 DIAGNOSIS — Z760 Encounter for issue of repeat prescription: Secondary | ICD-10-CM | POA: Diagnosis not present

## 2021-10-24 DIAGNOSIS — G8929 Other chronic pain: Secondary | ICD-10-CM | POA: Diagnosis not present

## 2021-10-24 DIAGNOSIS — M25561 Pain in right knee: Secondary | ICD-10-CM | POA: Diagnosis not present

## 2021-10-24 DIAGNOSIS — Z5181 Encounter for therapeutic drug level monitoring: Secondary | ICD-10-CM | POA: Diagnosis not present

## 2021-10-24 DIAGNOSIS — M533 Sacrococcygeal disorders, not elsewhere classified: Secondary | ICD-10-CM | POA: Diagnosis not present

## 2021-10-24 DIAGNOSIS — Z79891 Long term (current) use of opiate analgesic: Secondary | ICD-10-CM | POA: Diagnosis not present

## 2021-10-24 DIAGNOSIS — M47816 Spondylosis without myelopathy or radiculopathy, lumbar region: Secondary | ICD-10-CM | POA: Diagnosis not present

## 2021-10-24 DIAGNOSIS — M48061 Spinal stenosis, lumbar region without neurogenic claudication: Secondary | ICD-10-CM | POA: Diagnosis not present

## 2021-12-04 DIAGNOSIS — H2513 Age-related nuclear cataract, bilateral: Secondary | ICD-10-CM | POA: Diagnosis not present

## 2021-12-04 DIAGNOSIS — H04123 Dry eye syndrome of bilateral lacrimal glands: Secondary | ICD-10-CM | POA: Diagnosis not present

## 2021-12-11 DIAGNOSIS — R7309 Other abnormal glucose: Secondary | ICD-10-CM | POA: Diagnosis not present

## 2021-12-11 DIAGNOSIS — Z419 Encounter for procedure for purposes other than remedying health state, unspecified: Secondary | ICD-10-CM | POA: Diagnosis not present

## 2021-12-27 DIAGNOSIS — G8918 Other acute postprocedural pain: Secondary | ICD-10-CM | POA: Diagnosis not present

## 2021-12-27 DIAGNOSIS — M1711 Unilateral primary osteoarthritis, right knee: Secondary | ICD-10-CM | POA: Diagnosis not present

## 2022-01-02 DIAGNOSIS — Z96651 Presence of right artificial knee joint: Secondary | ICD-10-CM | POA: Diagnosis not present

## 2022-01-05 DIAGNOSIS — Z96651 Presence of right artificial knee joint: Secondary | ICD-10-CM | POA: Diagnosis not present

## 2022-01-09 DIAGNOSIS — Z96651 Presence of right artificial knee joint: Secondary | ICD-10-CM | POA: Diagnosis not present

## 2022-01-12 DIAGNOSIS — Z96651 Presence of right artificial knee joint: Secondary | ICD-10-CM | POA: Diagnosis not present

## 2022-01-16 DIAGNOSIS — Z79891 Long term (current) use of opiate analgesic: Secondary | ICD-10-CM | POA: Diagnosis not present

## 2022-01-16 DIAGNOSIS — M48061 Spinal stenosis, lumbar region without neurogenic claudication: Secondary | ICD-10-CM | POA: Diagnosis not present

## 2022-01-16 DIAGNOSIS — M533 Sacrococcygeal disorders, not elsewhere classified: Secondary | ICD-10-CM | POA: Diagnosis not present

## 2022-01-16 DIAGNOSIS — M5136 Other intervertebral disc degeneration, lumbar region: Secondary | ICD-10-CM | POA: Diagnosis not present

## 2022-01-16 DIAGNOSIS — M25561 Pain in right knee: Secondary | ICD-10-CM | POA: Diagnosis not present

## 2022-01-16 DIAGNOSIS — G8929 Other chronic pain: Secondary | ICD-10-CM | POA: Diagnosis not present

## 2022-01-16 DIAGNOSIS — E669 Obesity, unspecified: Secondary | ICD-10-CM | POA: Diagnosis not present

## 2022-01-16 DIAGNOSIS — Z0289 Encounter for other administrative examinations: Secondary | ICD-10-CM | POA: Diagnosis not present

## 2022-01-17 DIAGNOSIS — Z96651 Presence of right artificial knee joint: Secondary | ICD-10-CM | POA: Diagnosis not present

## 2022-01-19 DIAGNOSIS — Z96651 Presence of right artificial knee joint: Secondary | ICD-10-CM | POA: Diagnosis not present

## 2022-01-23 DIAGNOSIS — Z96651 Presence of right artificial knee joint: Secondary | ICD-10-CM | POA: Diagnosis not present

## 2022-01-30 DIAGNOSIS — Z96651 Presence of right artificial knee joint: Secondary | ICD-10-CM | POA: Diagnosis not present

## 2022-02-06 DIAGNOSIS — Z96651 Presence of right artificial knee joint: Secondary | ICD-10-CM | POA: Diagnosis not present

## 2022-02-09 DIAGNOSIS — Z96651 Presence of right artificial knee joint: Secondary | ICD-10-CM | POA: Diagnosis not present

## 2022-02-13 DIAGNOSIS — Z96651 Presence of right artificial knee joint: Secondary | ICD-10-CM | POA: Diagnosis not present

## 2022-02-23 DIAGNOSIS — Z96651 Presence of right artificial knee joint: Secondary | ICD-10-CM | POA: Diagnosis not present

## 2022-03-05 DIAGNOSIS — R7301 Impaired fasting glucose: Secondary | ICD-10-CM | POA: Diagnosis not present

## 2022-03-05 DIAGNOSIS — E785 Hyperlipidemia, unspecified: Secondary | ICD-10-CM | POA: Diagnosis not present

## 2022-03-05 DIAGNOSIS — I10 Essential (primary) hypertension: Secondary | ICD-10-CM | POA: Diagnosis not present

## 2022-03-05 DIAGNOSIS — E559 Vitamin D deficiency, unspecified: Secondary | ICD-10-CM | POA: Diagnosis not present

## 2022-03-05 DIAGNOSIS — F3341 Major depressive disorder, recurrent, in partial remission: Secondary | ICD-10-CM | POA: Diagnosis not present

## 2022-04-17 DIAGNOSIS — G8929 Other chronic pain: Secondary | ICD-10-CM | POA: Diagnosis not present

## 2022-04-17 DIAGNOSIS — M25561 Pain in right knee: Secondary | ICD-10-CM | POA: Diagnosis not present

## 2022-04-17 DIAGNOSIS — Z79891 Long term (current) use of opiate analgesic: Secondary | ICD-10-CM | POA: Diagnosis not present

## 2022-04-17 DIAGNOSIS — Z760 Encounter for issue of repeat prescription: Secondary | ICD-10-CM | POA: Diagnosis not present

## 2022-04-17 DIAGNOSIS — M48061 Spinal stenosis, lumbar region without neurogenic claudication: Secondary | ICD-10-CM | POA: Diagnosis not present

## 2022-04-17 DIAGNOSIS — M5136 Other intervertebral disc degeneration, lumbar region: Secondary | ICD-10-CM | POA: Diagnosis not present

## 2022-05-14 DIAGNOSIS — Z0000 Encounter for general adult medical examination without abnormal findings: Secondary | ICD-10-CM | POA: Diagnosis not present

## 2022-05-14 DIAGNOSIS — E669 Obesity, unspecified: Secondary | ICD-10-CM | POA: Diagnosis not present

## 2022-05-14 DIAGNOSIS — Z1331 Encounter for screening for depression: Secondary | ICD-10-CM | POA: Diagnosis not present

## 2022-05-14 DIAGNOSIS — E785 Hyperlipidemia, unspecified: Secondary | ICD-10-CM | POA: Diagnosis not present

## 2022-05-14 DIAGNOSIS — Z6832 Body mass index (BMI) 32.0-32.9, adult: Secondary | ICD-10-CM | POA: Diagnosis not present

## 2022-05-29 DIAGNOSIS — W19XXXA Unspecified fall, initial encounter: Secondary | ICD-10-CM | POA: Diagnosis not present

## 2022-05-29 DIAGNOSIS — Z96651 Presence of right artificial knee joint: Secondary | ICD-10-CM | POA: Diagnosis not present

## 2022-05-29 DIAGNOSIS — M25561 Pain in right knee: Secondary | ICD-10-CM | POA: Diagnosis not present

## 2022-06-07 DIAGNOSIS — Z1231 Encounter for screening mammogram for malignant neoplasm of breast: Secondary | ICD-10-CM | POA: Diagnosis not present

## 2022-06-12 DIAGNOSIS — Z20822 Contact with and (suspected) exposure to covid-19: Secondary | ICD-10-CM | POA: Diagnosis not present

## 2022-06-13 DIAGNOSIS — Z20822 Contact with and (suspected) exposure to covid-19: Secondary | ICD-10-CM | POA: Diagnosis not present

## 2022-06-16 DIAGNOSIS — Z20822 Contact with and (suspected) exposure to covid-19: Secondary | ICD-10-CM | POA: Diagnosis not present

## 2022-06-17 DIAGNOSIS — Z20822 Contact with and (suspected) exposure to covid-19: Secondary | ICD-10-CM | POA: Diagnosis not present

## 2022-06-18 DIAGNOSIS — G894 Chronic pain syndrome: Secondary | ICD-10-CM | POA: Diagnosis not present

## 2022-06-18 DIAGNOSIS — E785 Hyperlipidemia, unspecified: Secondary | ICD-10-CM | POA: Diagnosis not present

## 2022-06-18 DIAGNOSIS — I10 Essential (primary) hypertension: Secondary | ICD-10-CM | POA: Diagnosis not present

## 2022-06-18 DIAGNOSIS — F3341 Major depressive disorder, recurrent, in partial remission: Secondary | ICD-10-CM | POA: Diagnosis not present

## 2022-06-18 DIAGNOSIS — Z6832 Body mass index (BMI) 32.0-32.9, adult: Secondary | ICD-10-CM | POA: Diagnosis not present

## 2022-06-18 DIAGNOSIS — E559 Vitamin D deficiency, unspecified: Secondary | ICD-10-CM | POA: Diagnosis not present

## 2022-06-18 DIAGNOSIS — R7301 Impaired fasting glucose: Secondary | ICD-10-CM | POA: Diagnosis not present

## 2022-07-09 DIAGNOSIS — Z20822 Contact with and (suspected) exposure to covid-19: Secondary | ICD-10-CM | POA: Diagnosis not present

## 2022-07-10 DIAGNOSIS — Z20822 Contact with and (suspected) exposure to covid-19: Secondary | ICD-10-CM | POA: Diagnosis not present

## 2022-07-10 DIAGNOSIS — Z0289 Encounter for other administrative examinations: Secondary | ICD-10-CM | POA: Diagnosis not present

## 2022-07-10 DIAGNOSIS — Z79891 Long term (current) use of opiate analgesic: Secondary | ICD-10-CM | POA: Diagnosis not present

## 2022-07-10 DIAGNOSIS — M5136 Other intervertebral disc degeneration, lumbar region: Secondary | ICD-10-CM | POA: Diagnosis not present

## 2022-07-10 DIAGNOSIS — G8929 Other chronic pain: Secondary | ICD-10-CM | POA: Diagnosis not present

## 2022-07-10 DIAGNOSIS — M25561 Pain in right knee: Secondary | ICD-10-CM | POA: Diagnosis not present

## 2022-07-12 DIAGNOSIS — Z20822 Contact with and (suspected) exposure to covid-19: Secondary | ICD-10-CM | POA: Diagnosis not present

## 2022-07-13 DIAGNOSIS — Z20822 Contact with and (suspected) exposure to covid-19: Secondary | ICD-10-CM | POA: Diagnosis not present

## 2022-07-16 DIAGNOSIS — Z20822 Contact with and (suspected) exposure to covid-19: Secondary | ICD-10-CM | POA: Diagnosis not present

## 2022-07-17 DIAGNOSIS — Z20822 Contact with and (suspected) exposure to covid-19: Secondary | ICD-10-CM | POA: Diagnosis not present

## 2022-08-27 ENCOUNTER — Other Ambulatory Visit (HOSPITAL_BASED_OUTPATIENT_CLINIC_OR_DEPARTMENT_OTHER): Payer: Self-pay

## 2022-08-27 DIAGNOSIS — M47816 Spondylosis without myelopathy or radiculopathy, lumbar region: Secondary | ICD-10-CM | POA: Diagnosis not present

## 2022-08-27 DIAGNOSIS — G894 Chronic pain syndrome: Secondary | ICD-10-CM | POA: Diagnosis not present

## 2022-08-27 DIAGNOSIS — Z79891 Long term (current) use of opiate analgesic: Secondary | ICD-10-CM | POA: Diagnosis not present

## 2022-08-27 DIAGNOSIS — M25511 Pain in right shoulder: Secondary | ICD-10-CM | POA: Diagnosis not present

## 2022-08-27 DIAGNOSIS — M5416 Radiculopathy, lumbar region: Secondary | ICD-10-CM | POA: Diagnosis not present

## 2022-08-27 MED ORDER — DICLOFENAC SODIUM 1 % EX GEL
CUTANEOUS | 5 refills | Status: AC
  Filled 2022-08-27: qty 100, 10d supply, fill #0
  Filled 2022-08-27: qty 1000, 62d supply, fill #0

## 2022-08-27 MED ORDER — OXYCODONE HCL 10 MG PO TABS: ORAL_TABLET | ORAL | 0 refills | Status: AC

## 2022-08-27 MED ORDER — BUPRENORPHINE 20 MCG/HR TD PTWK
1.0000 | MEDICATED_PATCH | TRANSDERMAL | 0 refills | Status: AC
  Filled 2022-08-27: qty 4, 28d supply, fill #0

## 2022-08-27 MED ORDER — OXYCODONE HCL 10 MG PO TABS
ORAL_TABLET | ORAL | 0 refills | Status: AC
  Filled 2022-08-27: qty 75, 30d supply, fill #0

## 2022-08-28 ENCOUNTER — Other Ambulatory Visit (HOSPITAL_BASED_OUTPATIENT_CLINIC_OR_DEPARTMENT_OTHER): Payer: Self-pay

## 2022-08-29 ENCOUNTER — Other Ambulatory Visit (HOSPITAL_BASED_OUTPATIENT_CLINIC_OR_DEPARTMENT_OTHER): Payer: Self-pay

## 2022-09-20 DIAGNOSIS — M25561 Pain in right knee: Secondary | ICD-10-CM | POA: Diagnosis not present

## 2022-09-20 DIAGNOSIS — Z96651 Presence of right artificial knee joint: Secondary | ICD-10-CM | POA: Diagnosis not present

## 2022-09-26 DIAGNOSIS — M5416 Radiculopathy, lumbar region: Secondary | ICD-10-CM | POA: Diagnosis not present

## 2022-09-26 DIAGNOSIS — G894 Chronic pain syndrome: Secondary | ICD-10-CM | POA: Diagnosis not present

## 2022-09-26 DIAGNOSIS — M47816 Spondylosis without myelopathy or radiculopathy, lumbar region: Secondary | ICD-10-CM | POA: Diagnosis not present

## 2022-09-26 DIAGNOSIS — M25511 Pain in right shoulder: Secondary | ICD-10-CM | POA: Diagnosis not present

## 2022-09-28 DIAGNOSIS — Z23 Encounter for immunization: Secondary | ICD-10-CM | POA: Diagnosis not present

## 2022-09-28 DIAGNOSIS — E559 Vitamin D deficiency, unspecified: Secondary | ICD-10-CM | POA: Diagnosis not present

## 2022-09-28 DIAGNOSIS — Z86010 Personal history of colonic polyps: Secondary | ICD-10-CM | POA: Diagnosis not present

## 2022-09-28 DIAGNOSIS — R7301 Impaired fasting glucose: Secondary | ICD-10-CM | POA: Diagnosis not present

## 2022-09-28 DIAGNOSIS — E785 Hyperlipidemia, unspecified: Secondary | ICD-10-CM | POA: Diagnosis not present

## 2022-09-28 DIAGNOSIS — I10 Essential (primary) hypertension: Secondary | ICD-10-CM | POA: Diagnosis not present

## 2022-09-28 DIAGNOSIS — F3341 Major depressive disorder, recurrent, in partial remission: Secondary | ICD-10-CM | POA: Diagnosis not present

## 2022-09-28 DIAGNOSIS — R0982 Postnasal drip: Secondary | ICD-10-CM | POA: Diagnosis not present

## 2022-10-25 DIAGNOSIS — G894 Chronic pain syndrome: Secondary | ICD-10-CM | POA: Diagnosis not present

## 2022-10-25 DIAGNOSIS — M5416 Radiculopathy, lumbar region: Secondary | ICD-10-CM | POA: Diagnosis not present

## 2022-10-25 DIAGNOSIS — M47816 Spondylosis without myelopathy or radiculopathy, lumbar region: Secondary | ICD-10-CM | POA: Diagnosis not present

## 2022-10-25 DIAGNOSIS — M25511 Pain in right shoulder: Secondary | ICD-10-CM | POA: Diagnosis not present

## 2022-11-23 DIAGNOSIS — M47816 Spondylosis without myelopathy or radiculopathy, lumbar region: Secondary | ICD-10-CM | POA: Diagnosis not present

## 2022-11-23 DIAGNOSIS — M25511 Pain in right shoulder: Secondary | ICD-10-CM | POA: Diagnosis not present

## 2022-11-23 DIAGNOSIS — M5416 Radiculopathy, lumbar region: Secondary | ICD-10-CM | POA: Diagnosis not present

## 2022-11-23 DIAGNOSIS — G894 Chronic pain syndrome: Secondary | ICD-10-CM | POA: Diagnosis not present

## 2022-12-24 DIAGNOSIS — M25551 Pain in right hip: Secondary | ICD-10-CM | POA: Diagnosis not present

## 2022-12-24 DIAGNOSIS — G894 Chronic pain syndrome: Secondary | ICD-10-CM | POA: Diagnosis not present

## 2022-12-24 DIAGNOSIS — M47816 Spondylosis without myelopathy or radiculopathy, lumbar region: Secondary | ICD-10-CM | POA: Diagnosis not present

## 2022-12-24 DIAGNOSIS — Z79891 Long term (current) use of opiate analgesic: Secondary | ICD-10-CM | POA: Diagnosis not present

## 2022-12-24 DIAGNOSIS — M5416 Radiculopathy, lumbar region: Secondary | ICD-10-CM | POA: Diagnosis not present

## 2023-01-07 DIAGNOSIS — M7551 Bursitis of right shoulder: Secondary | ICD-10-CM | POA: Diagnosis not present

## 2023-01-21 DIAGNOSIS — M47816 Spondylosis without myelopathy or radiculopathy, lumbar region: Secondary | ICD-10-CM | POA: Diagnosis not present

## 2023-01-21 DIAGNOSIS — M5416 Radiculopathy, lumbar region: Secondary | ICD-10-CM | POA: Diagnosis not present

## 2023-01-21 DIAGNOSIS — M25551 Pain in right hip: Secondary | ICD-10-CM | POA: Diagnosis not present

## 2023-01-21 DIAGNOSIS — G894 Chronic pain syndrome: Secondary | ICD-10-CM | POA: Diagnosis not present

## 2023-02-14 ENCOUNTER — Telehealth: Payer: Self-pay

## 2023-02-14 NOTE — Patient Outreach (Signed)
  Care Coordination   Initial Visit Note   02/14/2023 Name: Evelyn Gonzales MRN: [id number] DOB: Feb 27, 1951  Evelyn Gonzales is a 71 y.o. year old female who sees Paulina Fusi, MD for primary care. I spoke with  Evelyn Gonzales by phone today.  What matters to the patients health and wellness today?  Placed call to patient to review and offer [facility] care coordination program.  Patient reports that she is doing well and denies any needs at this time.     SDOH assessments and interventions completed:  No     Care Coordination Interventions:  No, not indicated   Follow up plan: No further intervention required.   Encounter Outcome:  Pt. Refused   Rowe Pavy, RN, BSN, CEN [facility] [phone]

## 2023-02-21 DIAGNOSIS — M47816 Spondylosis without myelopathy or radiculopathy, lumbar region: Secondary | ICD-10-CM | POA: Diagnosis not present

## 2023-02-21 DIAGNOSIS — M25551 Pain in right hip: Secondary | ICD-10-CM | POA: Diagnosis not present

## 2023-02-21 DIAGNOSIS — M5416 Radiculopathy, lumbar region: Secondary | ICD-10-CM | POA: Diagnosis not present

## 2023-02-21 DIAGNOSIS — G894 Chronic pain syndrome: Secondary | ICD-10-CM | POA: Diagnosis not present

## 2023-03-15 DIAGNOSIS — C50911 Malignant neoplasm of unspecified site of right female breast: Secondary | ICD-10-CM | POA: Diagnosis not present

## 2023-03-18 DIAGNOSIS — M25551 Pain in right hip: Secondary | ICD-10-CM | POA: Diagnosis not present

## 2023-03-18 DIAGNOSIS — M47816 Spondylosis without myelopathy or radiculopathy, lumbar region: Secondary | ICD-10-CM | POA: Diagnosis not present

## 2023-03-18 DIAGNOSIS — G894 Chronic pain syndrome: Secondary | ICD-10-CM | POA: Diagnosis not present

## 2023-03-18 DIAGNOSIS — M5416 Radiculopathy, lumbar region: Secondary | ICD-10-CM | POA: Diagnosis not present

## 2023-03-29 DIAGNOSIS — I10 Essential (primary) hypertension: Secondary | ICD-10-CM | POA: Diagnosis not present

## 2023-03-29 DIAGNOSIS — R7301 Impaired fasting glucose: Secondary | ICD-10-CM | POA: Diagnosis not present

## 2023-03-29 DIAGNOSIS — E785 Hyperlipidemia, unspecified: Secondary | ICD-10-CM | POA: Diagnosis not present

## 2023-03-29 DIAGNOSIS — Z1231 Encounter for screening mammogram for malignant neoplasm of breast: Secondary | ICD-10-CM | POA: Diagnosis not present

## 2023-03-29 DIAGNOSIS — F3341 Major depressive disorder, recurrent, in partial remission: Secondary | ICD-10-CM | POA: Diagnosis not present

## 2023-03-29 DIAGNOSIS — E559 Vitamin D deficiency, unspecified: Secondary | ICD-10-CM | POA: Diagnosis not present

## 2023-04-16 DIAGNOSIS — G894 Chronic pain syndrome: Secondary | ICD-10-CM | POA: Diagnosis not present

## 2023-04-16 DIAGNOSIS — M4726 Other spondylosis with radiculopathy, lumbar region: Secondary | ICD-10-CM | POA: Diagnosis not present

## 2023-04-16 DIAGNOSIS — M25551 Pain in right hip: Secondary | ICD-10-CM | POA: Diagnosis not present

## 2023-05-15 DIAGNOSIS — M47816 Spondylosis without myelopathy or radiculopathy, lumbar region: Secondary | ICD-10-CM | POA: Diagnosis not present

## 2023-05-15 DIAGNOSIS — M5416 Radiculopathy, lumbar region: Secondary | ICD-10-CM | POA: Diagnosis not present

## 2023-05-15 DIAGNOSIS — M25551 Pain in right hip: Secondary | ICD-10-CM | POA: Diagnosis not present

## 2023-05-15 DIAGNOSIS — G894 Chronic pain syndrome: Secondary | ICD-10-CM | POA: Diagnosis not present

## 2023-06-18 DIAGNOSIS — M25551 Pain in right hip: Secondary | ICD-10-CM | POA: Diagnosis not present

## 2023-06-18 DIAGNOSIS — M47816 Spondylosis without myelopathy or radiculopathy, lumbar region: Secondary | ICD-10-CM | POA: Diagnosis not present

## 2023-06-18 DIAGNOSIS — Z79891 Long term (current) use of opiate analgesic: Secondary | ICD-10-CM | POA: Diagnosis not present

## 2023-06-18 DIAGNOSIS — G894 Chronic pain syndrome: Secondary | ICD-10-CM | POA: Diagnosis not present

## 2023-06-18 DIAGNOSIS — M5416 Radiculopathy, lumbar region: Secondary | ICD-10-CM | POA: Diagnosis not present

## 2023-07-16 DIAGNOSIS — G894 Chronic pain syndrome: Secondary | ICD-10-CM | POA: Diagnosis not present

## 2023-07-16 DIAGNOSIS — M25551 Pain in right hip: Secondary | ICD-10-CM | POA: Diagnosis not present

## 2023-07-16 DIAGNOSIS — M5416 Radiculopathy, lumbar region: Secondary | ICD-10-CM | POA: Diagnosis not present

## 2023-07-16 DIAGNOSIS — M47816 Spondylosis without myelopathy or radiculopathy, lumbar region: Secondary | ICD-10-CM | POA: Diagnosis not present

## 2023-07-26 DIAGNOSIS — Z1231 Encounter for screening mammogram for malignant neoplasm of breast: Secondary | ICD-10-CM | POA: Diagnosis not present

## 2023-08-13 DIAGNOSIS — M25551 Pain in right hip: Secondary | ICD-10-CM | POA: Diagnosis not present

## 2023-08-13 DIAGNOSIS — M47816 Spondylosis without myelopathy or radiculopathy, lumbar region: Secondary | ICD-10-CM | POA: Diagnosis not present

## 2023-08-13 DIAGNOSIS — M5416 Radiculopathy, lumbar region: Secondary | ICD-10-CM | POA: Diagnosis not present

## 2023-08-13 DIAGNOSIS — G894 Chronic pain syndrome: Secondary | ICD-10-CM | POA: Diagnosis not present

## 2023-08-23 DIAGNOSIS — Z1231 Encounter for screening mammogram for malignant neoplasm of breast: Secondary | ICD-10-CM | POA: Diagnosis not present

## 2023-08-28 DIAGNOSIS — Z0000 Encounter for general adult medical examination without abnormal findings: Secondary | ICD-10-CM | POA: Diagnosis not present

## 2023-08-28 DIAGNOSIS — Z9181 History of falling: Secondary | ICD-10-CM | POA: Diagnosis not present

## 2023-09-03 DIAGNOSIS — M25561 Pain in right knee: Secondary | ICD-10-CM | POA: Diagnosis not present

## 2023-09-03 DIAGNOSIS — Z96651 Presence of right artificial knee joint: Secondary | ICD-10-CM | POA: Diagnosis not present

## 2023-09-03 DIAGNOSIS — G8929 Other chronic pain: Secondary | ICD-10-CM | POA: Diagnosis not present

## 2023-09-16 DIAGNOSIS — G894 Chronic pain syndrome: Secondary | ICD-10-CM | POA: Diagnosis not present

## 2023-09-16 DIAGNOSIS — M47816 Spondylosis without myelopathy or radiculopathy, lumbar region: Secondary | ICD-10-CM | POA: Diagnosis not present

## 2023-09-16 DIAGNOSIS — M5416 Radiculopathy, lumbar region: Secondary | ICD-10-CM | POA: Diagnosis not present

## 2023-09-16 DIAGNOSIS — M25551 Pain in right hip: Secondary | ICD-10-CM | POA: Diagnosis not present

## 2023-09-30 DIAGNOSIS — R7301 Impaired fasting glucose: Secondary | ICD-10-CM | POA: Diagnosis not present

## 2023-09-30 DIAGNOSIS — M1711 Unilateral primary osteoarthritis, right knee: Secondary | ICD-10-CM | POA: Diagnosis not present

## 2023-09-30 DIAGNOSIS — E559 Vitamin D deficiency, unspecified: Secondary | ICD-10-CM | POA: Diagnosis not present

## 2023-09-30 DIAGNOSIS — F3341 Major depressive disorder, recurrent, in partial remission: Secondary | ICD-10-CM | POA: Diagnosis not present

## 2023-09-30 DIAGNOSIS — I10 Essential (primary) hypertension: Secondary | ICD-10-CM | POA: Diagnosis not present

## 2023-09-30 DIAGNOSIS — E785 Hyperlipidemia, unspecified: Secondary | ICD-10-CM | POA: Diagnosis not present

## 2023-09-30 DIAGNOSIS — Z23 Encounter for immunization: Secondary | ICD-10-CM | POA: Diagnosis not present

## 2023-10-08 DIAGNOSIS — Z96651 Presence of right artificial knee joint: Secondary | ICD-10-CM | POA: Diagnosis not present

## 2023-10-08 DIAGNOSIS — G8929 Other chronic pain: Secondary | ICD-10-CM | POA: Diagnosis not present

## 2023-10-08 DIAGNOSIS — M25561 Pain in right knee: Secondary | ICD-10-CM | POA: Diagnosis not present

## 2023-10-14 DIAGNOSIS — G894 Chronic pain syndrome: Secondary | ICD-10-CM | POA: Diagnosis not present

## 2023-10-14 DIAGNOSIS — M5416 Radiculopathy, lumbar region: Secondary | ICD-10-CM | POA: Diagnosis not present

## 2023-10-14 DIAGNOSIS — M47816 Spondylosis without myelopathy or radiculopathy, lumbar region: Secondary | ICD-10-CM | POA: Diagnosis not present

## 2023-10-14 DIAGNOSIS — M25551 Pain in right hip: Secondary | ICD-10-CM | POA: Diagnosis not present

## 2023-11-12 DIAGNOSIS — M47816 Spondylosis without myelopathy or radiculopathy, lumbar region: Secondary | ICD-10-CM | POA: Diagnosis not present

## 2023-11-12 DIAGNOSIS — G894 Chronic pain syndrome: Secondary | ICD-10-CM | POA: Diagnosis not present

## 2023-11-12 DIAGNOSIS — M25551 Pain in right hip: Secondary | ICD-10-CM | POA: Diagnosis not present

## 2023-11-12 DIAGNOSIS — M5416 Radiculopathy, lumbar region: Secondary | ICD-10-CM | POA: Diagnosis not present

## 2023-12-11 DIAGNOSIS — M47816 Spondylosis without myelopathy or radiculopathy, lumbar region: Secondary | ICD-10-CM | POA: Diagnosis not present

## 2023-12-11 DIAGNOSIS — M5416 Radiculopathy, lumbar region: Secondary | ICD-10-CM | POA: Diagnosis not present

## 2023-12-11 DIAGNOSIS — G894 Chronic pain syndrome: Secondary | ICD-10-CM | POA: Diagnosis not present

## 2023-12-11 DIAGNOSIS — M25551 Pain in right hip: Secondary | ICD-10-CM | POA: Diagnosis not present

## 2023-12-31 DIAGNOSIS — H25042 Posterior subcapsular polar age-related cataract, left eye: Secondary | ICD-10-CM | POA: Diagnosis not present

## 2023-12-31 DIAGNOSIS — H2513 Age-related nuclear cataract, bilateral: Secondary | ICD-10-CM | POA: Diagnosis not present

## 2023-12-31 DIAGNOSIS — H35033 Hypertensive retinopathy, bilateral: Secondary | ICD-10-CM | POA: Diagnosis not present

## 2023-12-31 DIAGNOSIS — H35373 Puckering of macula, bilateral: Secondary | ICD-10-CM | POA: Diagnosis not present

## 2024-01-06 DIAGNOSIS — M5416 Radiculopathy, lumbar region: Secondary | ICD-10-CM | POA: Diagnosis not present

## 2024-01-06 DIAGNOSIS — Z79891 Long term (current) use of opiate analgesic: Secondary | ICD-10-CM | POA: Diagnosis not present

## 2024-01-06 DIAGNOSIS — M47816 Spondylosis without myelopathy or radiculopathy, lumbar region: Secondary | ICD-10-CM | POA: Diagnosis not present

## 2024-01-06 DIAGNOSIS — G894 Chronic pain syndrome: Secondary | ICD-10-CM | POA: Diagnosis not present

## 2024-01-06 DIAGNOSIS — M25551 Pain in right hip: Secondary | ICD-10-CM | POA: Diagnosis not present

## 2024-02-03 DIAGNOSIS — M25551 Pain in right hip: Secondary | ICD-10-CM | POA: Diagnosis not present

## 2024-02-03 DIAGNOSIS — G894 Chronic pain syndrome: Secondary | ICD-10-CM | POA: Diagnosis not present

## 2024-02-03 DIAGNOSIS — M5416 Radiculopathy, lumbar region: Secondary | ICD-10-CM | POA: Diagnosis not present

## 2024-02-03 DIAGNOSIS — M47816 Spondylosis without myelopathy or radiculopathy, lumbar region: Secondary | ICD-10-CM | POA: Diagnosis not present

## 2024-03-09 DIAGNOSIS — M25551 Pain in right hip: Secondary | ICD-10-CM | POA: Diagnosis not present

## 2024-03-09 DIAGNOSIS — G894 Chronic pain syndrome: Secondary | ICD-10-CM | POA: Diagnosis not present

## 2024-03-09 DIAGNOSIS — M5416 Radiculopathy, lumbar region: Secondary | ICD-10-CM | POA: Diagnosis not present

## 2024-03-09 DIAGNOSIS — M47816 Spondylosis without myelopathy or radiculopathy, lumbar region: Secondary | ICD-10-CM | POA: Diagnosis not present

## 2024-03-16 DIAGNOSIS — Z01818 Encounter for other preprocedural examination: Secondary | ICD-10-CM | POA: Diagnosis not present

## 2024-03-16 DIAGNOSIS — I10 Essential (primary) hypertension: Secondary | ICD-10-CM | POA: Diagnosis not present

## 2024-03-16 DIAGNOSIS — M25561 Pain in right knee: Secondary | ICD-10-CM | POA: Diagnosis not present

## 2024-03-16 DIAGNOSIS — E559 Vitamin D deficiency, unspecified: Secondary | ICD-10-CM | POA: Diagnosis not present

## 2024-03-16 DIAGNOSIS — F3341 Major depressive disorder, recurrent, in partial remission: Secondary | ICD-10-CM | POA: Diagnosis not present

## 2024-03-16 DIAGNOSIS — R7301 Impaired fasting glucose: Secondary | ICD-10-CM | POA: Diagnosis not present

## 2024-03-16 DIAGNOSIS — E785 Hyperlipidemia, unspecified: Secondary | ICD-10-CM | POA: Diagnosis not present

## 2024-03-24 DIAGNOSIS — M5416 Radiculopathy, lumbar region: Secondary | ICD-10-CM | POA: Diagnosis not present

## 2024-03-24 DIAGNOSIS — M47816 Spondylosis without myelopathy or radiculopathy, lumbar region: Secondary | ICD-10-CM | POA: Diagnosis not present

## 2024-03-24 DIAGNOSIS — G894 Chronic pain syndrome: Secondary | ICD-10-CM | POA: Diagnosis not present

## 2024-03-24 DIAGNOSIS — M25551 Pain in right hip: Secondary | ICD-10-CM | POA: Diagnosis not present

## 2024-03-25 DIAGNOSIS — M25561 Pain in right knee: Secondary | ICD-10-CM | POA: Diagnosis not present

## 2024-03-25 DIAGNOSIS — G8929 Other chronic pain: Secondary | ICD-10-CM | POA: Diagnosis not present

## 2024-03-30 DIAGNOSIS — Z96651 Presence of right artificial knee joint: Secondary | ICD-10-CM | POA: Diagnosis not present

## 2024-03-30 DIAGNOSIS — M1711 Unilateral primary osteoarthritis, right knee: Secondary | ICD-10-CM | POA: Diagnosis not present

## 2024-03-30 DIAGNOSIS — G8918 Other acute postprocedural pain: Secondary | ICD-10-CM | POA: Diagnosis not present

## 2024-04-09 DIAGNOSIS — Z96651 Presence of right artificial knee joint: Secondary | ICD-10-CM | POA: Diagnosis not present

## 2024-04-13 DIAGNOSIS — R2689 Other abnormalities of gait and mobility: Secondary | ICD-10-CM | POA: Diagnosis not present

## 2024-04-13 DIAGNOSIS — M25461 Effusion, right knee: Secondary | ICD-10-CM | POA: Diagnosis not present

## 2024-04-13 DIAGNOSIS — M25661 Stiffness of right knee, not elsewhere classified: Secondary | ICD-10-CM | POA: Diagnosis not present

## 2024-04-13 DIAGNOSIS — M25561 Pain in right knee: Secondary | ICD-10-CM | POA: Diagnosis not present

## 2024-04-15 DIAGNOSIS — M25461 Effusion, right knee: Secondary | ICD-10-CM | POA: Diagnosis not present

## 2024-04-15 DIAGNOSIS — R2689 Other abnormalities of gait and mobility: Secondary | ICD-10-CM | POA: Diagnosis not present

## 2024-04-15 DIAGNOSIS — M25661 Stiffness of right knee, not elsewhere classified: Secondary | ICD-10-CM | POA: Diagnosis not present

## 2024-04-15 DIAGNOSIS — M25561 Pain in right knee: Secondary | ICD-10-CM | POA: Diagnosis not present

## 2024-04-22 DIAGNOSIS — R2689 Other abnormalities of gait and mobility: Secondary | ICD-10-CM | POA: Diagnosis not present

## 2024-04-22 DIAGNOSIS — M25661 Stiffness of right knee, not elsewhere classified: Secondary | ICD-10-CM | POA: Diagnosis not present

## 2024-04-22 DIAGNOSIS — M25561 Pain in right knee: Secondary | ICD-10-CM | POA: Diagnosis not present

## 2024-04-22 DIAGNOSIS — M25461 Effusion, right knee: Secondary | ICD-10-CM | POA: Diagnosis not present

## 2024-04-24 DIAGNOSIS — M25661 Stiffness of right knee, not elsewhere classified: Secondary | ICD-10-CM | POA: Diagnosis not present

## 2024-04-24 DIAGNOSIS — R2689 Other abnormalities of gait and mobility: Secondary | ICD-10-CM | POA: Diagnosis not present

## 2024-04-24 DIAGNOSIS — M25561 Pain in right knee: Secondary | ICD-10-CM | POA: Diagnosis not present

## 2024-04-24 DIAGNOSIS — M25461 Effusion, right knee: Secondary | ICD-10-CM | POA: Diagnosis not present

## 2024-04-28 DIAGNOSIS — M25561 Pain in right knee: Secondary | ICD-10-CM | POA: Diagnosis not present

## 2024-04-28 DIAGNOSIS — M25461 Effusion, right knee: Secondary | ICD-10-CM | POA: Diagnosis not present

## 2024-04-28 DIAGNOSIS — M25661 Stiffness of right knee, not elsewhere classified: Secondary | ICD-10-CM | POA: Diagnosis not present

## 2024-04-28 DIAGNOSIS — R2689 Other abnormalities of gait and mobility: Secondary | ICD-10-CM | POA: Diagnosis not present

## 2024-05-01 DIAGNOSIS — M25661 Stiffness of right knee, not elsewhere classified: Secondary | ICD-10-CM | POA: Diagnosis not present

## 2024-05-01 DIAGNOSIS — R2689 Other abnormalities of gait and mobility: Secondary | ICD-10-CM | POA: Diagnosis not present

## 2024-05-01 DIAGNOSIS — M25561 Pain in right knee: Secondary | ICD-10-CM | POA: Diagnosis not present

## 2024-05-01 DIAGNOSIS — M25461 Effusion, right knee: Secondary | ICD-10-CM | POA: Diagnosis not present

## 2024-05-04 DIAGNOSIS — M25551 Pain in right hip: Secondary | ICD-10-CM | POA: Diagnosis not present

## 2024-05-04 DIAGNOSIS — G894 Chronic pain syndrome: Secondary | ICD-10-CM | POA: Diagnosis not present

## 2024-05-04 DIAGNOSIS — M5416 Radiculopathy, lumbar region: Secondary | ICD-10-CM | POA: Diagnosis not present

## 2024-05-04 DIAGNOSIS — M47816 Spondylosis without myelopathy or radiculopathy, lumbar region: Secondary | ICD-10-CM | POA: Diagnosis not present

## 2024-05-06 DIAGNOSIS — M25661 Stiffness of right knee, not elsewhere classified: Secondary | ICD-10-CM | POA: Diagnosis not present

## 2024-05-06 DIAGNOSIS — M25561 Pain in right knee: Secondary | ICD-10-CM | POA: Diagnosis not present

## 2024-05-06 DIAGNOSIS — R2689 Other abnormalities of gait and mobility: Secondary | ICD-10-CM | POA: Diagnosis not present

## 2024-05-06 DIAGNOSIS — M25461 Effusion, right knee: Secondary | ICD-10-CM | POA: Diagnosis not present

## 2024-05-12 DIAGNOSIS — M25661 Stiffness of right knee, not elsewhere classified: Secondary | ICD-10-CM | POA: Diagnosis not present

## 2024-05-12 DIAGNOSIS — M25461 Effusion, right knee: Secondary | ICD-10-CM | POA: Diagnosis not present

## 2024-05-12 DIAGNOSIS — R2689 Other abnormalities of gait and mobility: Secondary | ICD-10-CM | POA: Diagnosis not present

## 2024-05-12 DIAGNOSIS — M25561 Pain in right knee: Secondary | ICD-10-CM | POA: Diagnosis not present

## 2024-05-15 DIAGNOSIS — M25561 Pain in right knee: Secondary | ICD-10-CM | POA: Diagnosis not present

## 2024-05-15 DIAGNOSIS — M25661 Stiffness of right knee, not elsewhere classified: Secondary | ICD-10-CM | POA: Diagnosis not present

## 2024-05-15 DIAGNOSIS — R2689 Other abnormalities of gait and mobility: Secondary | ICD-10-CM | POA: Diagnosis not present

## 2024-05-15 DIAGNOSIS — M25461 Effusion, right knee: Secondary | ICD-10-CM | POA: Diagnosis not present

## 2024-05-19 DIAGNOSIS — R2689 Other abnormalities of gait and mobility: Secondary | ICD-10-CM | POA: Diagnosis not present

## 2024-05-19 DIAGNOSIS — M25461 Effusion, right knee: Secondary | ICD-10-CM | POA: Diagnosis not present

## 2024-05-19 DIAGNOSIS — M25661 Stiffness of right knee, not elsewhere classified: Secondary | ICD-10-CM | POA: Diagnosis not present

## 2024-05-19 DIAGNOSIS — M25561 Pain in right knee: Secondary | ICD-10-CM | POA: Diagnosis not present

## 2024-05-22 DIAGNOSIS — M25661 Stiffness of right knee, not elsewhere classified: Secondary | ICD-10-CM | POA: Diagnosis not present

## 2024-05-22 DIAGNOSIS — M25461 Effusion, right knee: Secondary | ICD-10-CM | POA: Diagnosis not present

## 2024-05-22 DIAGNOSIS — M8589 Other specified disorders of bone density and structure, multiple sites: Secondary | ICD-10-CM | POA: Diagnosis not present

## 2024-05-22 DIAGNOSIS — R2689 Other abnormalities of gait and mobility: Secondary | ICD-10-CM | POA: Diagnosis not present

## 2024-05-22 DIAGNOSIS — M25561 Pain in right knee: Secondary | ICD-10-CM | POA: Diagnosis not present

## 2024-05-25 DIAGNOSIS — M85852 Other specified disorders of bone density and structure, left thigh: Secondary | ICD-10-CM | POA: Diagnosis not present

## 2024-05-25 DIAGNOSIS — M85851 Other specified disorders of bone density and structure, right thigh: Secondary | ICD-10-CM | POA: Diagnosis not present

## 2024-05-26 DIAGNOSIS — M25661 Stiffness of right knee, not elsewhere classified: Secondary | ICD-10-CM | POA: Diagnosis not present

## 2024-05-26 DIAGNOSIS — M25561 Pain in right knee: Secondary | ICD-10-CM | POA: Diagnosis not present

## 2024-05-26 DIAGNOSIS — R2689 Other abnormalities of gait and mobility: Secondary | ICD-10-CM | POA: Diagnosis not present

## 2024-05-26 DIAGNOSIS — M25461 Effusion, right knee: Secondary | ICD-10-CM | POA: Diagnosis not present

## 2024-06-04 DIAGNOSIS — G894 Chronic pain syndrome: Secondary | ICD-10-CM | POA: Diagnosis not present

## 2024-06-04 DIAGNOSIS — M5416 Radiculopathy, lumbar region: Secondary | ICD-10-CM | POA: Diagnosis not present

## 2024-06-04 DIAGNOSIS — M47816 Spondylosis without myelopathy or radiculopathy, lumbar region: Secondary | ICD-10-CM | POA: Diagnosis not present

## 2024-06-04 DIAGNOSIS — M25551 Pain in right hip: Secondary | ICD-10-CM | POA: Diagnosis not present

## 2024-07-06 DIAGNOSIS — M65332 Trigger finger, left middle finger: Secondary | ICD-10-CM | POA: Diagnosis not present

## 2024-08-03 DIAGNOSIS — M25551 Pain in right hip: Secondary | ICD-10-CM | POA: Diagnosis not present

## 2024-08-03 DIAGNOSIS — M5416 Radiculopathy, lumbar region: Secondary | ICD-10-CM | POA: Diagnosis not present

## 2024-08-03 DIAGNOSIS — G894 Chronic pain syndrome: Secondary | ICD-10-CM | POA: Diagnosis not present

## 2024-08-03 DIAGNOSIS — M47816 Spondylosis without myelopathy or radiculopathy, lumbar region: Secondary | ICD-10-CM | POA: Diagnosis not present

## 2024-08-05 DIAGNOSIS — H25813 Combined forms of age-related cataract, bilateral: Secondary | ICD-10-CM | POA: Diagnosis not present

## 2024-08-21 DIAGNOSIS — H25812 Combined forms of age-related cataract, left eye: Secondary | ICD-10-CM | POA: Diagnosis not present

## 2024-08-21 DIAGNOSIS — I10 Essential (primary) hypertension: Secondary | ICD-10-CM | POA: Diagnosis not present
